# Patient Record
Sex: Female | Born: 1965 | Race: White | Hispanic: No | Marital: Single | Smoking: Current every day smoker
Health system: Southern US, Community
[De-identification: ages and names within clinical notes are randomized; demographics above are authoritative.]

## PROBLEM LIST (undated history)

## (undated) DIAGNOSIS — F419 Anxiety disorder, unspecified: Secondary | ICD-10-CM

## (undated) DIAGNOSIS — Z720 Tobacco use: Secondary | ICD-10-CM

## (undated) DIAGNOSIS — J449 Chronic obstructive pulmonary disease, unspecified: Secondary | ICD-10-CM

## (undated) HISTORY — PX: OTHER SURGICAL HISTORY: SHX169

---

## 2004-12-13 ENCOUNTER — Emergency Department: Payer: Self-pay | Admitting: Emergency Medicine

## 2004-12-14 ENCOUNTER — Emergency Department: Payer: Self-pay | Admitting: Emergency Medicine

## 2006-10-19 ENCOUNTER — Emergency Department: Payer: Self-pay

## 2007-05-05 ENCOUNTER — Emergency Department: Payer: Self-pay | Admitting: Emergency Medicine

## 2008-11-03 ENCOUNTER — Emergency Department: Payer: Self-pay | Admitting: Emergency Medicine

## 2009-01-14 ENCOUNTER — Other Ambulatory Visit: Payer: Self-pay | Admitting: Emergency Medicine

## 2009-01-14 IMAGING — CR DG CHEST 2V
1 series · 2 of 2 positions shown · non-contrast
Comparison: none

REASON FOR EXAM: cough, congestion, chest pain, pt in waiting room
COMMENTS:

PROCEDURE:     DXR - DXR CHEST PA (OR AP) AND LATERAL  - [DATE] [DATE]
RESULT:     The lungs are adequately inflated and clear. There is no focal
infiltrate. On the lateral film mild flattening of the hemidiaphragms is
seen. There is no pleural effusion. The bony thorax appears intact.

[Series 1: view not recorded · 0.17mm/px · 2 of 2 slices shown]
[im 1/2]
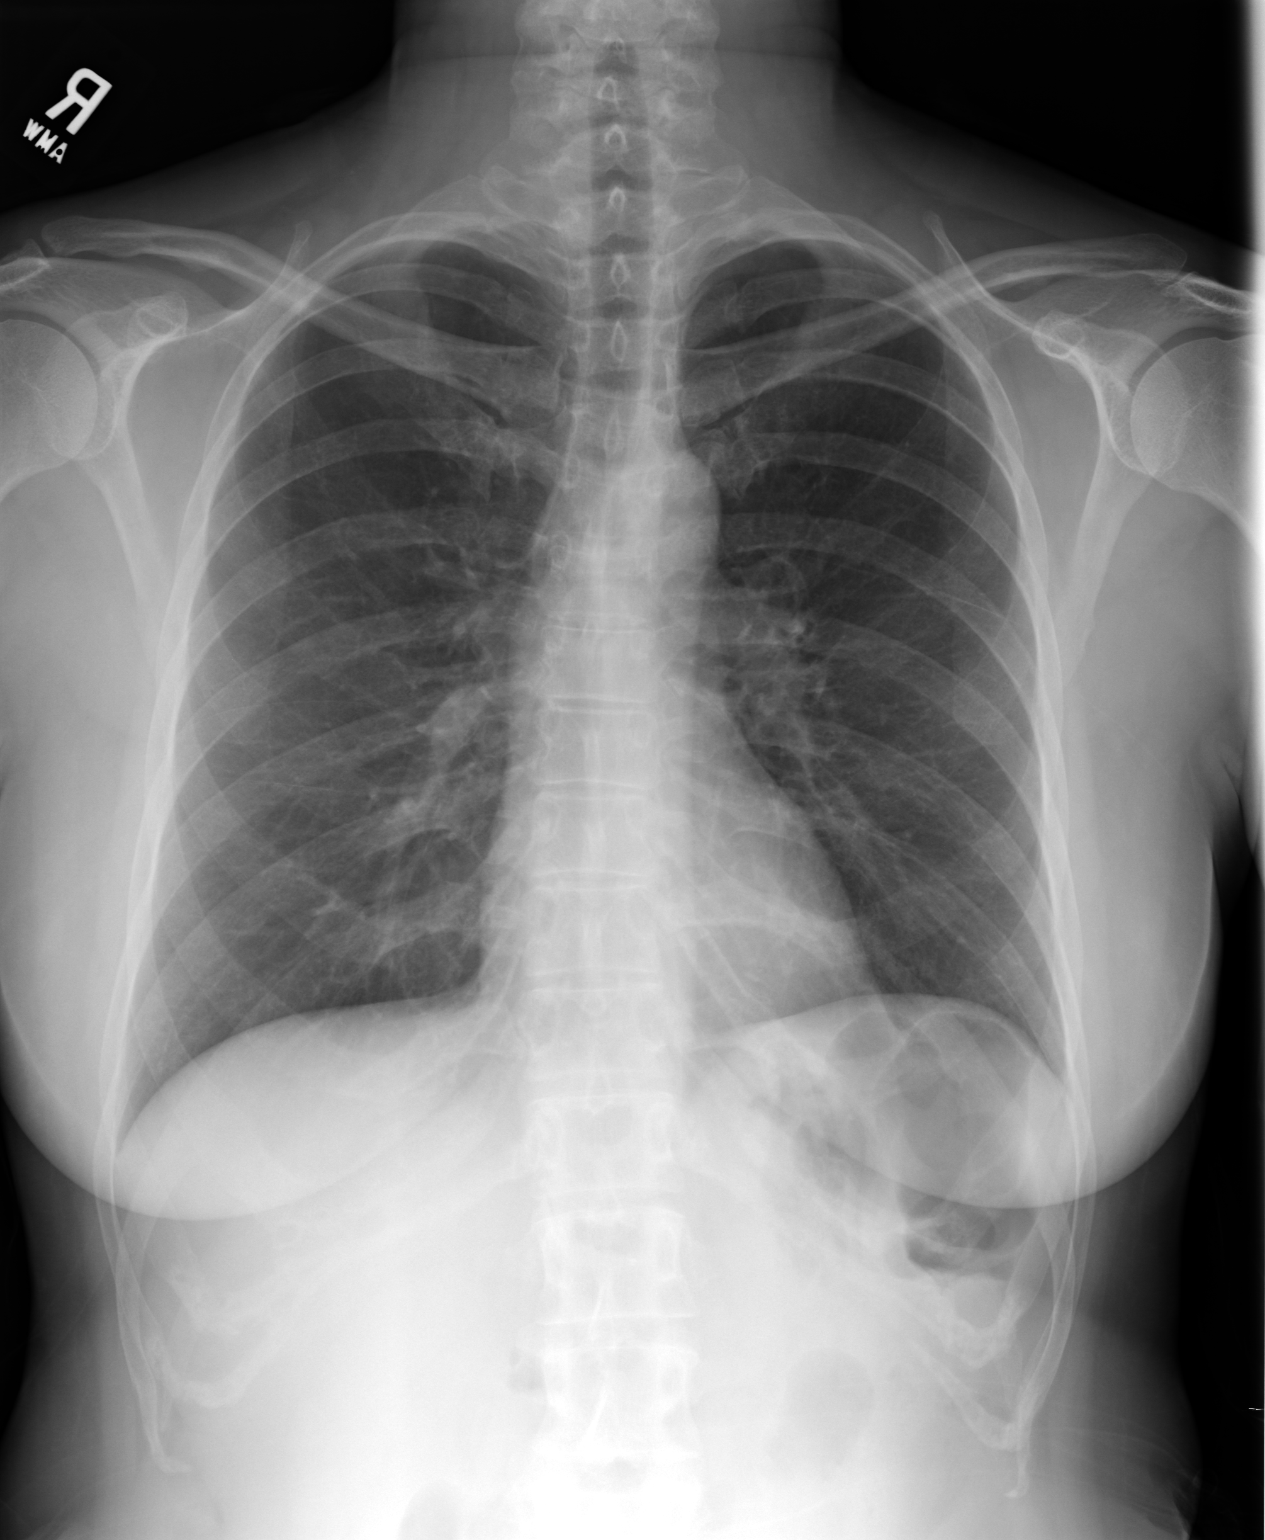
[im 2/2]
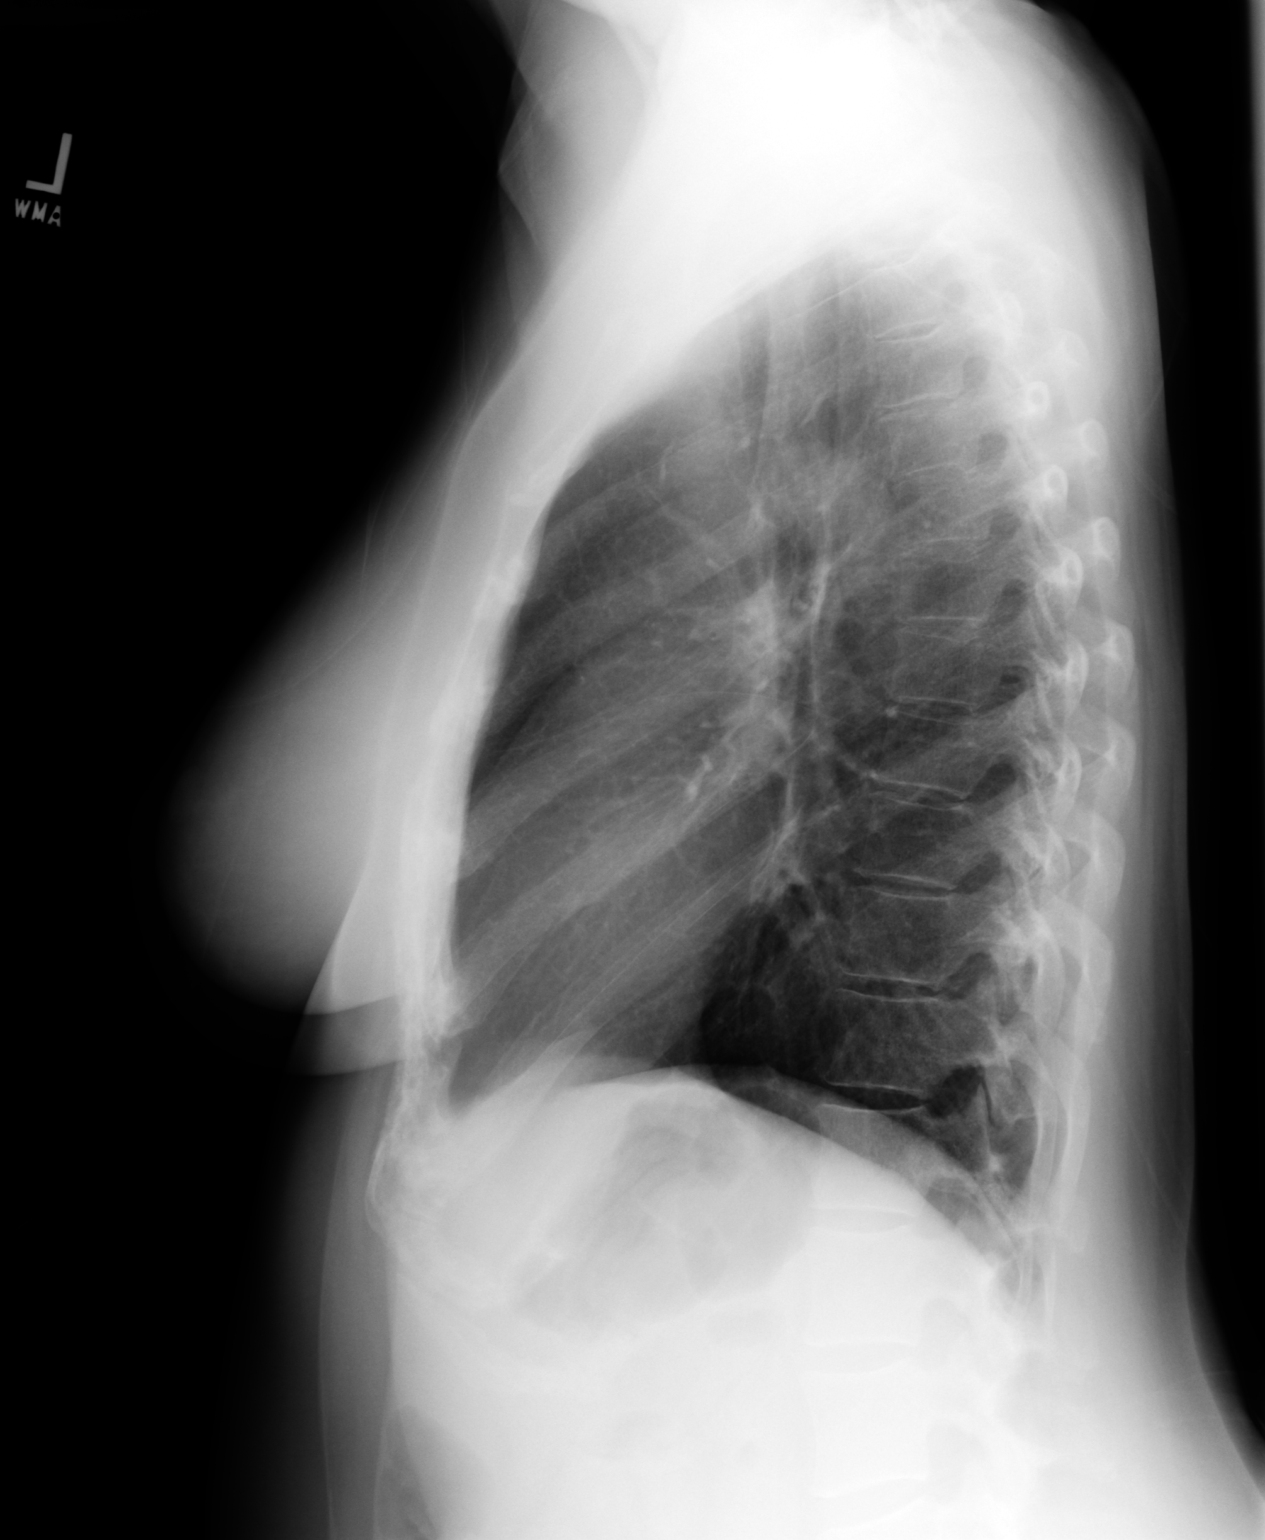

[2 of 2 positions shown; findings below may reference images not displayed]

IMPRESSION: I do not see evidence of pneumonia. There is likely an
element of underlying COPD or reactive airway disease.

## 2011-01-23 ENCOUNTER — Emergency Department: Payer: Self-pay | Admitting: Emergency Medicine

## 2011-05-11 ENCOUNTER — Encounter: Payer: Self-pay | Admitting: *Deleted

## 2011-05-11 ENCOUNTER — Emergency Department (HOSPITAL_COMMUNITY)
Admission: EM | Admit: 2011-05-11 | Discharge: 2011-05-11 | Disposition: A | Discharge status: Home / Self Care | Payer: Self-pay | Attending: Emergency Medicine | Admitting: Emergency Medicine

## 2011-05-11 ENCOUNTER — Other Ambulatory Visit: Payer: Self-pay

## 2011-05-11 ENCOUNTER — Emergency Department (HOSPITAL_COMMUNITY): Payer: Self-pay

## 2011-05-11 DIAGNOSIS — K0889 Other specified disorders of teeth and supporting structures: Secondary | ICD-10-CM

## 2011-05-11 DIAGNOSIS — R072 Precordial pain: Secondary | ICD-10-CM | POA: Insufficient documentation

## 2011-05-11 DIAGNOSIS — R1013 Epigastric pain: Secondary | ICD-10-CM | POA: Insufficient documentation

## 2011-05-11 DIAGNOSIS — J4489 Other specified chronic obstructive pulmonary disease: Secondary | ICD-10-CM | POA: Insufficient documentation

## 2011-05-11 DIAGNOSIS — K089 Disorder of teeth and supporting structures, unspecified: Secondary | ICD-10-CM | POA: Insufficient documentation

## 2011-05-11 DIAGNOSIS — F17200 Nicotine dependence, unspecified, uncomplicated: Secondary | ICD-10-CM | POA: Insufficient documentation

## 2011-05-11 DIAGNOSIS — J449 Chronic obstructive pulmonary disease, unspecified: Secondary | ICD-10-CM | POA: Insufficient documentation

## 2011-05-11 DIAGNOSIS — K029 Dental caries, unspecified: Secondary | ICD-10-CM | POA: Insufficient documentation

## 2011-05-11 HISTORY — DX: Chronic obstructive pulmonary disease, unspecified: J44.9

## 2011-05-11 LAB — COMPREHENSIVE METABOLIC PANEL
Alkaline Phosphatase: 79 U/L (ref 39–117)
BUN: 10 mg/dL (ref 6–23)
Creatinine, Ser: 0.81 mg/dL (ref 0.50–1.10)
GFR calc Af Amer: >90 mL/min (ref >90)
Glucose, Bld: 95 mg/dL (ref 70–99)
Potassium: 3.7 mEq/L (ref 3.5–5.1)
Total Bilirubin: 0.2 mg/dL — ABNORMAL LOW (ref 0.3–1.2)
Total Protein: 6.5 g/dL (ref 6.0–8.3)

## 2011-05-11 LAB — CBC
HCT: 38.4 % (ref 36.0–46.0)
Hemoglobin: 13.1 g/dL (ref 12.0–15.0)
MCH: 30.7 pg (ref 26.0–34.0)
MCHC: 34.1 g/dL (ref 30.0–36.0)
MCV: 89.9 fL (ref 78.0–100.0)
RBC: 4.27 MIL/uL (ref 3.87–5.11)

## 2011-05-11 LAB — DIFFERENTIAL
Basophils Relative: 1 % (ref 0–1)
Eosinophils Absolute: 0.2 10*3/uL (ref 0.0–0.7)
Lymphs Abs: 2 10*3/uL (ref 0.7–4.0)
Monocytes Absolute: 0.6 10*3/uL (ref 0.1–1.0)
Monocytes Relative: 7 % (ref 3–12)
Neutrophils Relative %: 66 % (ref 43–77)

## 2011-05-11 LAB — LIPASE, BLOOD: Lipase: 33 U/L (ref 11–59)

## 2011-05-11 LAB — POCT I-STAT TROPONIN I: Troponin i, poc: 0.04 ng/mL (ref 0.00–0.08)

## 2011-05-11 IMAGING — CR DG CHEST 2V
2 series · 2 of 2 positions shown · non-contrast
Comparison: None.

CLINICAL DATA: Chest pain.

CHEST - 2 VIEW

[w chest pa]
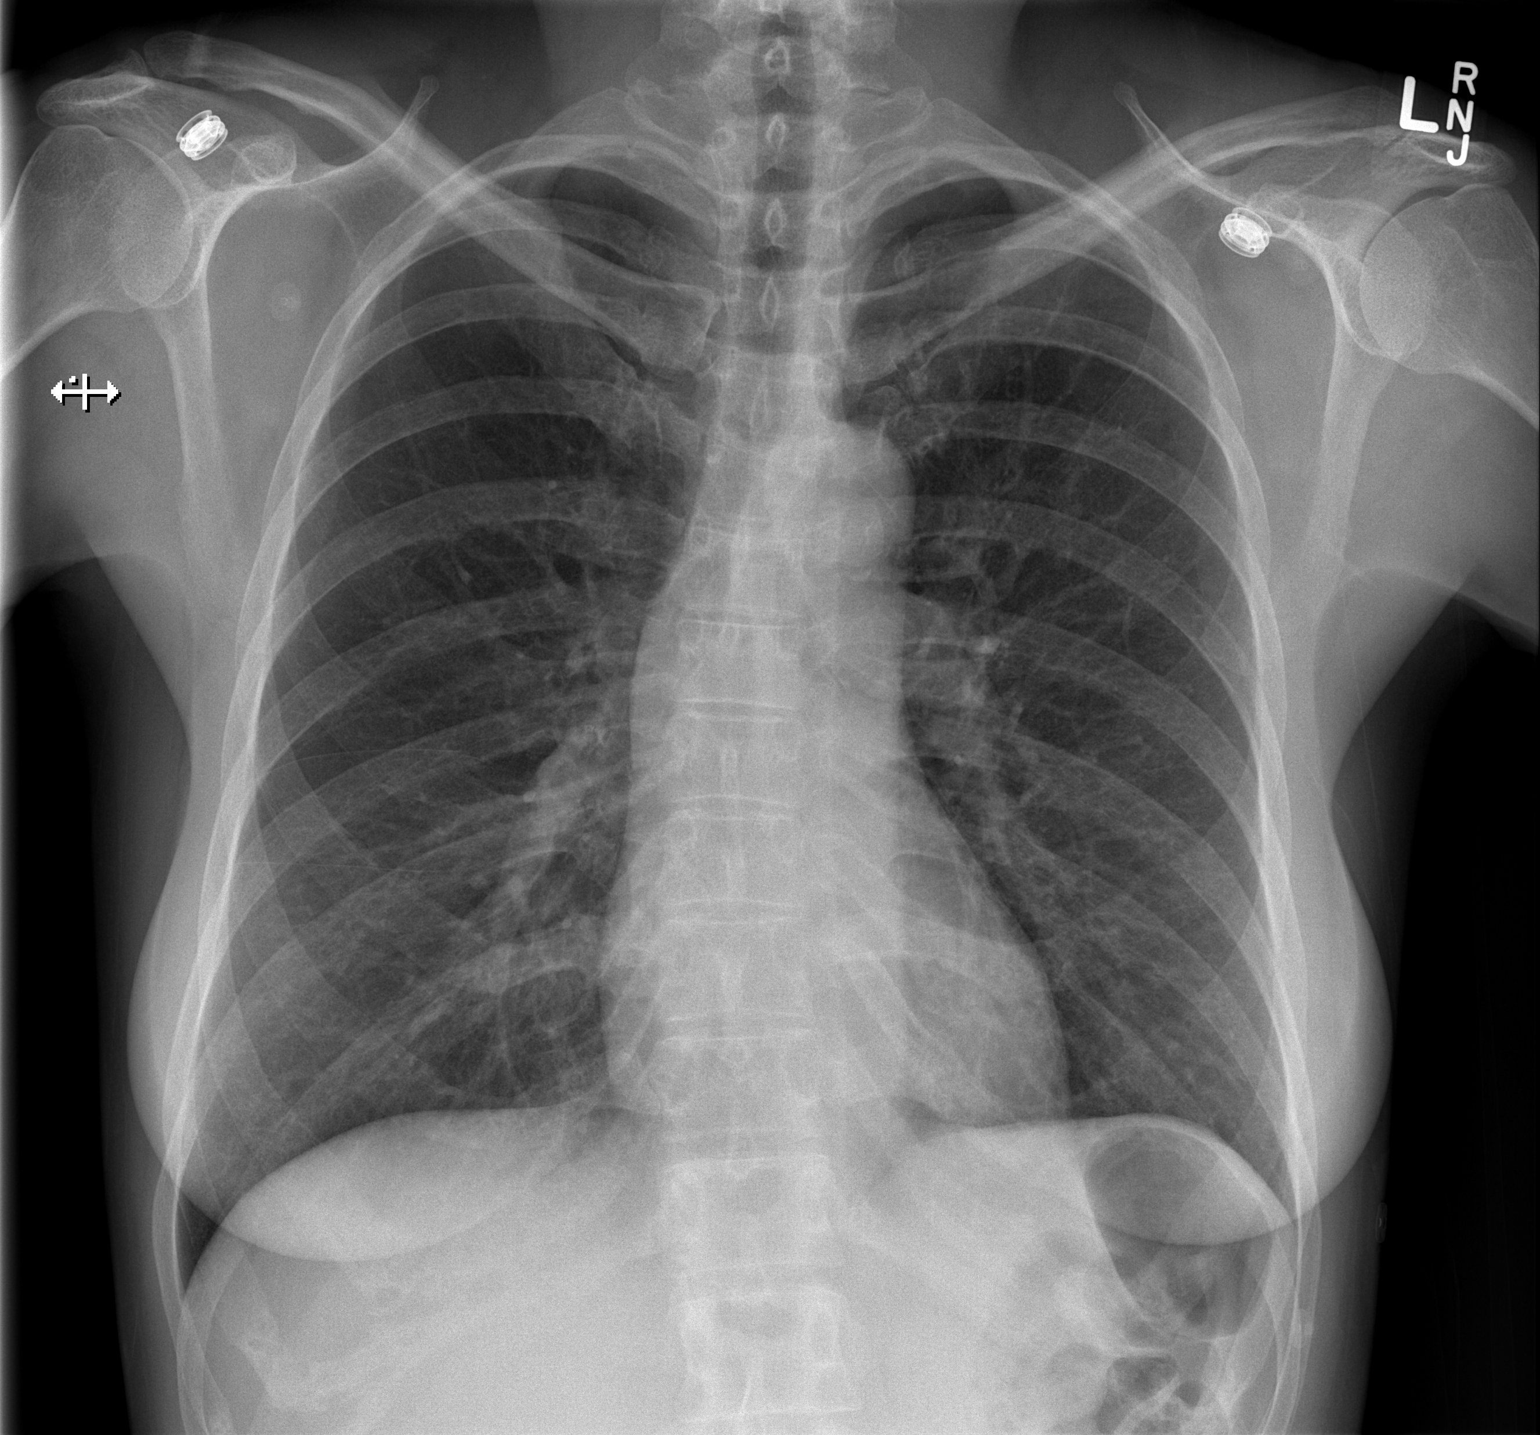

[w chest lat]
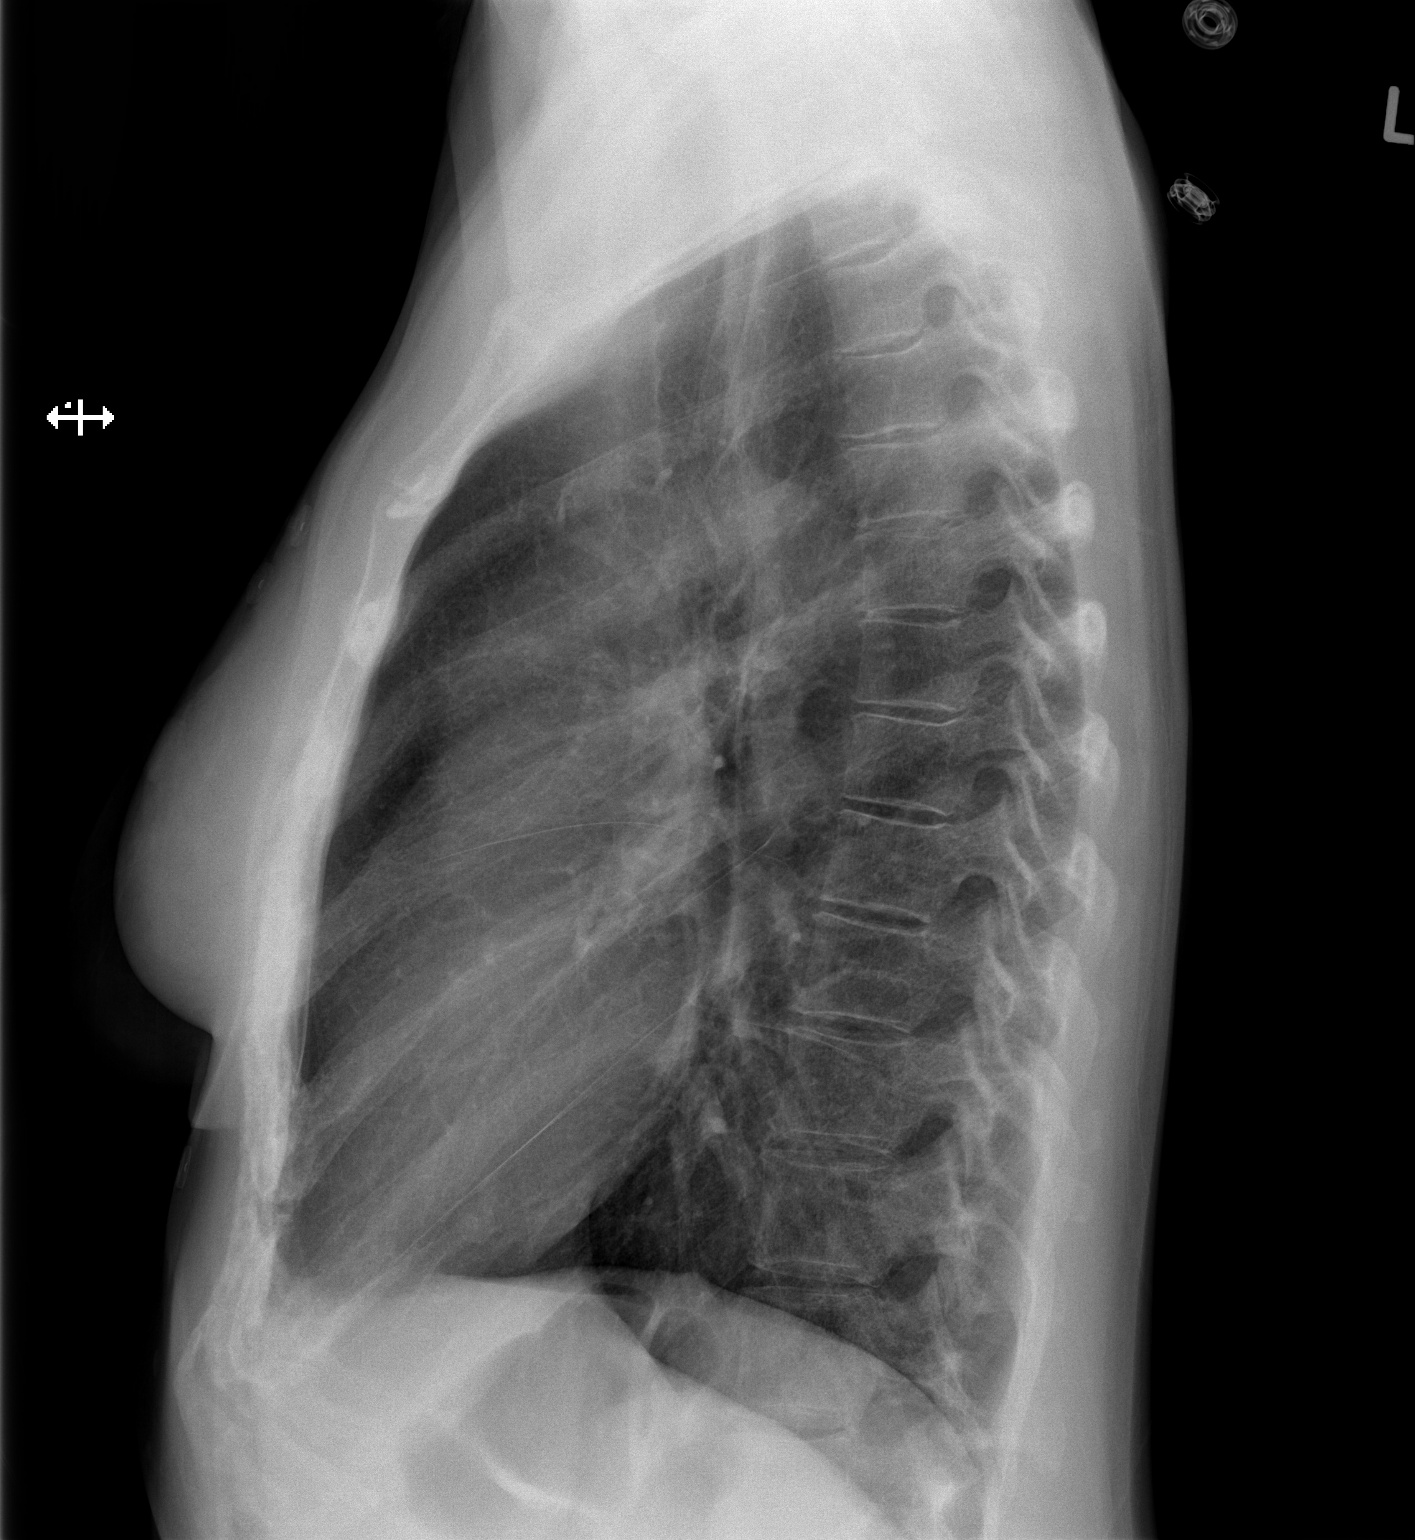

[2 of 2 positions shown; findings below may reference images not displayed]

FINDINGS: Cardiac and mediastinal contours appear normal.

The lungs appear clear.

No pleural effusion is identified.
IMPRESSION: No significant abnormality identified.

## 2011-05-11 MED ORDER — PENICILLIN V POTASSIUM 500 MG PO TABS: 500.0000 mg | ORAL_TABLET | Freq: Three times a day (TID) | ORAL | Status: AC

## 2011-05-11 MED ORDER — GI COCKTAIL ~~LOC~~
30.0000 mL | Freq: Once | ORAL | Status: AC
  Administered 2011-05-11: 30 mL via ORAL
  Filled 2011-05-11: qty 30

## 2011-05-11 MED ORDER — IBUPROFEN 800 MG PO TABS: ORAL_TABLET | ORAL | Status: DC

## 2011-05-11 MED ORDER — ACETAMINOPHEN-CODEINE #3 300-30 MG PO TABS
1.0000 | ORAL_TABLET | Freq: Once | ORAL | Status: AC
  Administered 2011-05-11: 1 via ORAL
  Filled 2011-05-11: qty 1

## 2011-05-11 MED ORDER — ACETAMINOPHEN 325 MG PO TABS
650.0000 mg | ORAL_TABLET | Freq: Once | ORAL | Status: AC
  Administered 2011-05-11: 650 mg via ORAL
  Filled 2011-05-11: qty 1

## 2011-05-11 MED ORDER — ONDANSETRON 4 MG PO TBDP
4.0000 mg | ORAL_TABLET | Freq: Once | ORAL | Status: AC
  Administered 2011-05-11: 4 mg via ORAL
  Filled 2011-05-11: qty 1

## 2011-05-11 MED ORDER — ACETAMINOPHEN-CODEINE #3 300-30 MG PO TABS: 1.0000 | ORAL_TABLET | Freq: Four times a day (QID) | ORAL | Status: AC | PRN

## 2011-05-11 NOTE — ED Notes (Signed)
Tylenol order changed by md after it was scanned but prior to given to pt, will return med to pyxis

## 2011-05-11 NOTE — ED Notes (Signed)
Pt given discharge instructions and rx, verb understanding, amb indep to discharge window

## 2011-05-11 NOTE — ED Notes (Signed)
Pt states she has had a toothache for two days and took 8 aspirin yesterday and now she is burning in her upper chest and hurting,  No nausea or vomiting,  No heart disease

## 2011-05-11 NOTE — ED Provider Notes (Signed)
History     CSN: [id number]  Arrival date & time 05/11/11  0410   First MD Initiated Contact with Patient 05/11/11 0602      Chief Complaint  Patient presents with  . Chest Pain    (Consider location/radiation/quality/duration/timing/severity/associated sxs/prior treatment) HPI  Patient who takes no medicines on a regular basis and states she has no known medical problems but who smokes half-pack of cigarettes a day has been doing so for approximately 30 years presents to emergency department complaining of gradual onset epigastric and mid sternal chest discomfort that began at 2 AM this morning. Patient states she has a multiple day history of left upper dental pain associated with a cavity and states that she took 4 aspirin yesterday morning for the pain. Patient works third shift and prior to going in last night took four additional aspirin at midnight. Patient states that approximately 2 AM she began to feel a burning sensation in her epigastric/lower chest area. Patient states since onset of discomfort, pain has been constant and unchanging. Patient denies associated shortness of breath, nausea, sweating, fevers, chills, nausea, vomiting or diarrhea. Patient denies any early family history of heart disease or heart attack. Patient denies aggravating or alleviating factors.  Past Medical History  Diagnosis Date  . COPD (chronic obstructive pulmonary disease)     Past Surgical History  Procedure Date  . Hemoroidectomy 2003/2002  . Tubiligation     Family History  Problem Relation Age of Onset  . Diabetes Mother   . Hyperlipidemia Mother   . Hypertension Mother     History  Substance Use Topics  . Smoking status: Current Everyday Smoker -- 0.5 packs/day for 25 years    Types: Cigarettes  . Smokeless tobacco: Not on file  . Alcohol Use: No    OB History    Grav Para Term Preterm Abortions TAB SAB Ect Mult Living                  Review of Systems  All other systems  reviewed and are negative.    Allergies  Review of patient's allergies indicates no known allergies.  Home Medications  No current outpatient prescriptions on file.  BP 152/90  Pulse 66  Temp(Src) 98.6 F (37 C) (Oral)  Resp 18  Ht 5\' 3"  (1.6 m)  Wt 108 lb (48.988 kg)  BMI 19.13 kg/m2  SpO2 100%  LMP 04/26/2011  Physical Exam  Nursing note and vitals reviewed. Constitutional: She is oriented to person, place, and time. She appears well-developed and well-nourished. No distress.  HENT:  Head: Normocephalic and atraumatic.  Eyes: Conjunctivae and EOM are normal. Pupils are equal, round, and reactive to light.  Neck: Normal range of motion. Neck supple.  Cardiovascular: Normal rate, regular rhythm, normal heart sounds and intact distal pulses.  Exam reveals no gallop and no friction rub.   No murmur heard. Pulmonary/Chest: Effort normal and breath sounds normal. No respiratory distress. She has no wheezes. She has no rales. She exhibits no tenderness.  Abdominal: Bowel sounds are normal. She exhibits no distension and no mass. There is tenderness in the epigastric area. There is no rebound and no guarding.       Mild epigastric tenderness to palpation. No rigidity or rebound TTP.  Musculoskeletal: Normal range of motion. She exhibits no edema and no tenderness.  Neurological: She is alert and oriented to person, place, and time.  Skin: Skin is warm and dry. No rash noted. She is not  diaphoretic. No erythema.  Psychiatric: She has a normal mood and affect.    ED Course  Procedures (including critical care time)  Labs Reviewed  COMPREHENSIVE METABOLIC PANEL - Abnormal; Notable for the following:    Albumin 3.4 (*)    Total Bilirubin 0.2 (*)    GFR calc non Af Amer 85 (*)    All other components within normal limits  CBC  DIFFERENTIAL  LIPASE, BLOOD  POCT I-STAT TROPONIN I  I-STAT TROPONIN I   Dg Chest 2 View  05/11/2011  *RADIOLOGY REPORT*  Clinical Data: Chest pain.   CHEST - 2 VIEW  Comparison: None.  Findings: Cardiac and mediastinal contours appear normal.  The lungs appear clear.  No pleural effusion is identified.  IMPRESSION:  No significant abnormality identified.  Original Report Authenticated By: Dellia Cloud, M.D.    Date: 05/11/2011  Rate: 58  Rhythm: normal sinus rhythm  QRS Axis: normal  Intervals: normal  ST/T Wave abnormalities: normal  Conduction Disutrbances:none  Narrative Interpretation: compared to Jun 04, 2008  Old EKG Reviewed: unchanged   By mouth GI cocktail and ODT Zofran.   1. Pain, dental   2. Epigastric pain       MDM  Patient is low risk for pulmonary embolism in this perc negative as well as low risk acute coronary syndrome despite her tobacco abuse. Patient has dental pain without signs or symptoms of dental abscess but I will prophylactically treat with penicillin. Patient's epigastric burning up into the substernal region is improved after GI cocktail and is  likely secondary to her recent large amount of aspirin use. I spoke at length with patient about appropriate over-the-counter pain medicine use with food on stomach. Patient had pain constant for greater than 4 hours with no acute findings on EKG and negative troponin. Patient is agreeable to treating her dental pain with OTC pain meds and covering with PCN then following up with dentist but to return to emergency department for changing or worsening symptoms        Jenness Corner, [place] 05/11/11 [phone]

## 2011-05-15 NOTE — ED Provider Notes (Signed)
Medical screening examination/treatment/procedure(s) were performed by non-physician practitioner and as supervising physician I was immediately available for consultation/collaboration.  Chynna Buerkle K Shandee Jergens-Rasch, MD 05/15/11 0017 

## 2013-03-28 ENCOUNTER — Emergency Department: Payer: Self-pay | Admitting: Emergency Medicine

## 2013-03-28 LAB — URINALYSIS, COMPLETE
Hyaline Cast: 2
Nitrite: NEGATIVE
Ph: 5 (ref 4.5–8.0)
Specific Gravity: 1.028 (ref 1.003–1.030)
Squamous Epithelial: 5

## 2013-03-28 IMAGING — CR DG CHEST 2V
1 series · 2 of 2 positions shown · non-contrast
Comparison: Chest radiograph [DATE]

CLINICAL DATA: Cough, congestion.

EXAM:
CHEST  2 VIEW

[Series 1: pa · 0.17mm/px · 2 of 2 slices shown]
[im 1/2]
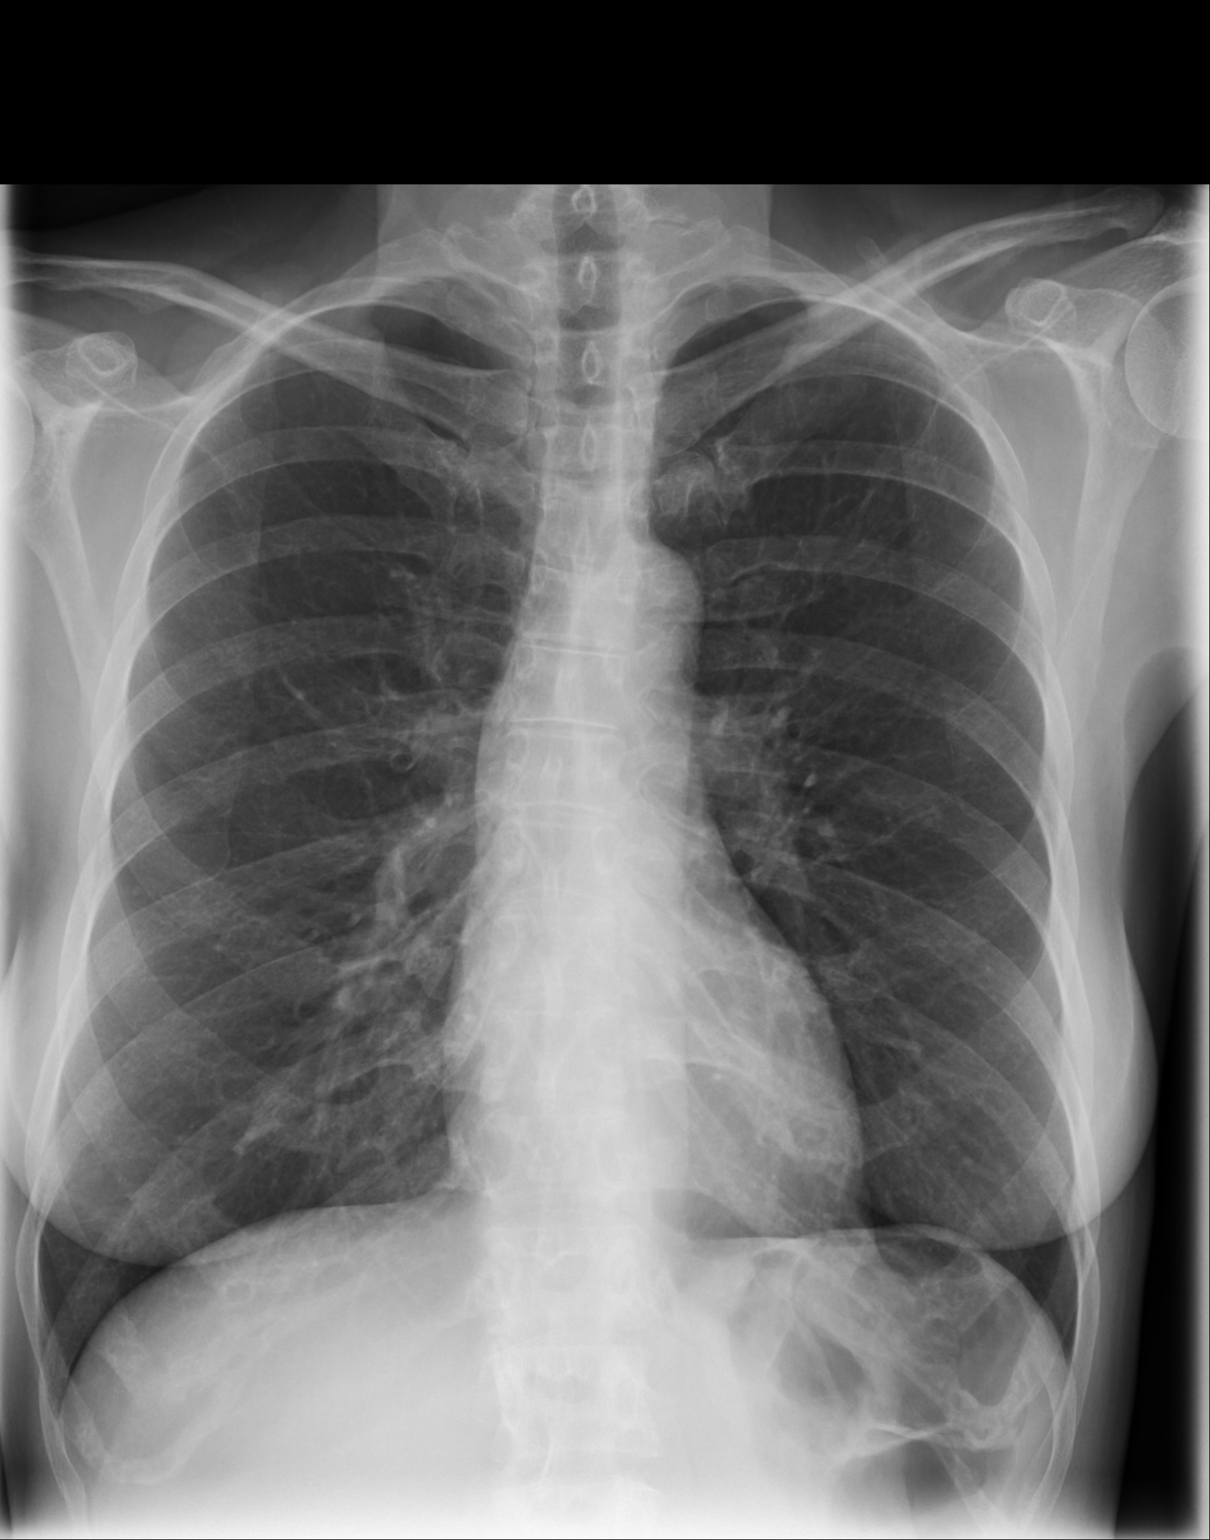
[im 2/2]
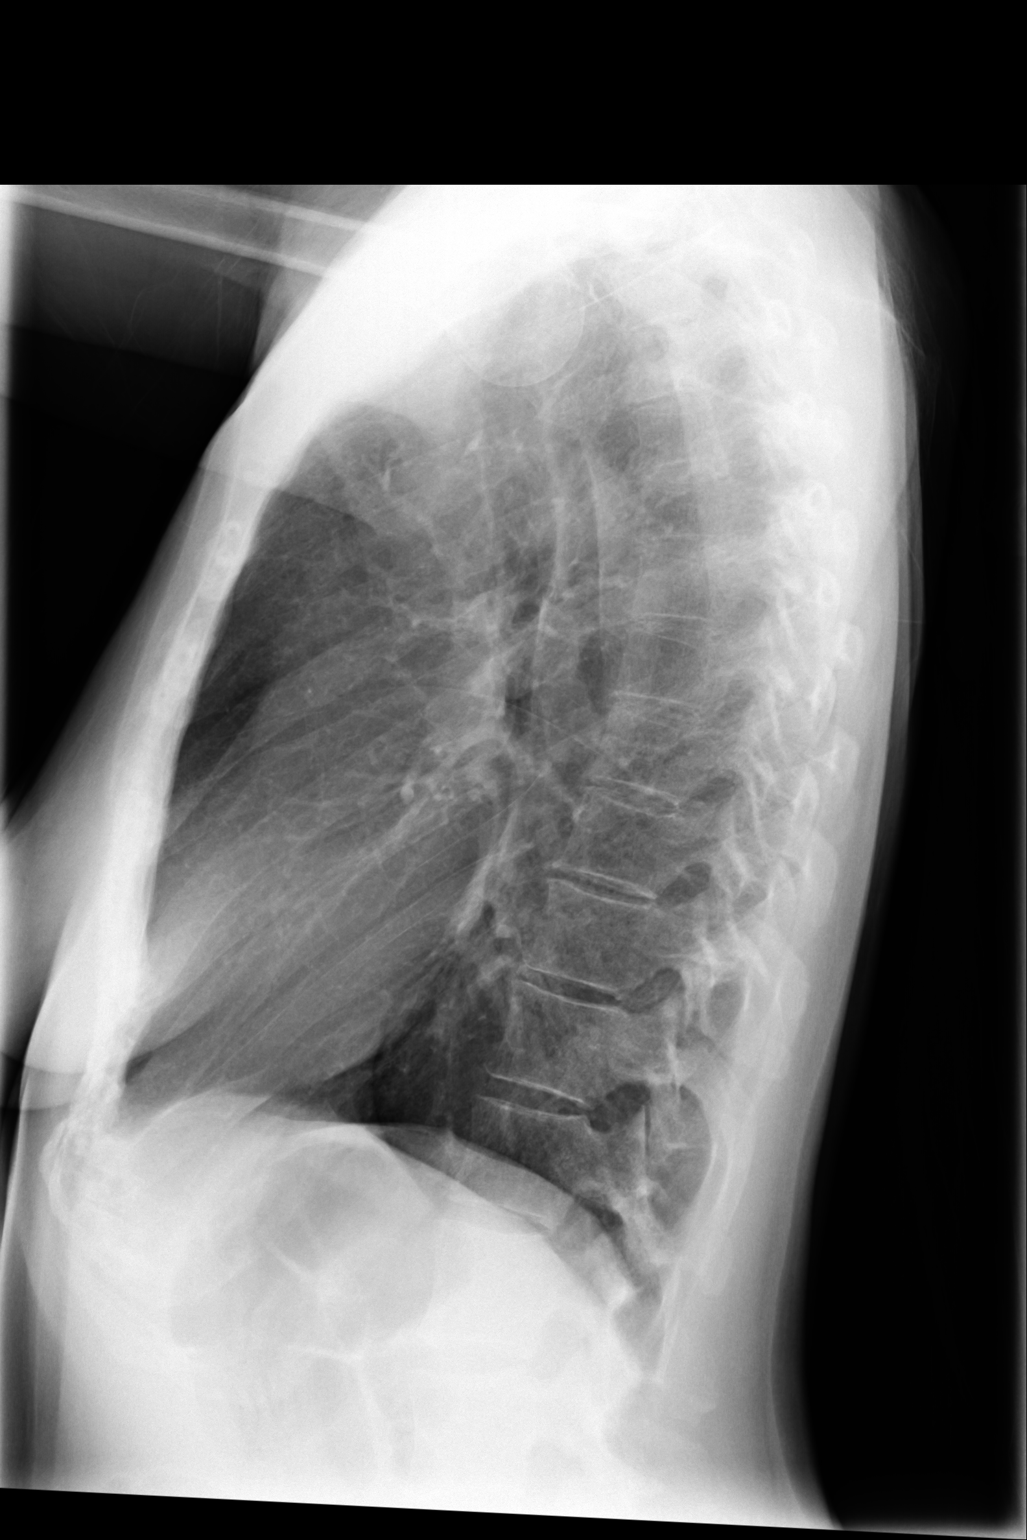

[2 of 2 positions shown; findings below may reference images not displayed]

FINDINGS: Cardiomediastinal silhouette is unremarkable. The lungs are clear
without pleural effusions or focal consolidations. Mild pulmonary
hyper expansion could reflect increased inspiratory effort.
Pulmonary vasculature is unremarkable. Trachea projects midline and
there is no pneumothorax. Soft tissue planes and included osseous
structures are nonsuspicious.
IMPRESSION: No active cardiopulmonary disease.

  By: JIM

## 2013-03-30 ENCOUNTER — Emergency Department: Payer: Self-pay | Admitting: Emergency Medicine

## 2014-02-13 ENCOUNTER — Emergency Department: Payer: Self-pay | Admitting: Emergency Medicine

## 2014-02-13 LAB — CBC
HCT: 43.4 % (ref 35.0–47.0)
HGB: 14.1 g/dL (ref 12.0–16.0)
MCH: 29.9 pg (ref 26.0–34.0)
MCHC: 32.5 g/dL (ref 32.0–36.0)
MCV: 92 fL (ref 80–100)
Platelet: 238 10*3/uL (ref 150–440)
RBC: 4.71 10*6/uL (ref 3.80–5.20)
RDW: 13.7 % (ref 11.5–14.5)
WBC: 9.6 10*3/uL (ref 3.6–11.0)

## 2014-02-13 LAB — COMPREHENSIVE METABOLIC PANEL
Albumin: 3.7 g/dL (ref 3.4–5.0)
Alkaline Phosphatase: 87 U/L
Anion Gap: 6 — ABNORMAL LOW (ref 7–16)
BUN: 10 mg/dL (ref 7–18)
Bilirubin,Total: 0.5 mg/dL (ref 0.2–1.0)
CALCIUM: 8.8 mg/dL (ref 8.5–10.1)
CO2: 27 mmol/L (ref 21–32)
Chloride: 108 mmol/L — ABNORMAL HIGH (ref 98–107)
Creatinine: 0.78 mg/dL (ref 0.60–1.30)
EGFR (Non-African Amer.): >60
GLUCOSE: 92 mg/dL (ref 65–99)
Osmolality: 280 (ref 275–301)
Potassium: 4.1 mmol/L (ref 3.5–5.1)
SGOT(AST): 9 U/L — ABNORMAL LOW (ref 15–37)
SGPT (ALT): 14 U/L
Sodium: 141 mmol/L (ref 136–145)
Total Protein: 7.1 g/dL (ref 6.4–8.2)

## 2014-02-13 LAB — ACETAMINOPHEN LEVEL: Acetaminophen: <2 ug/mL

## 2014-02-13 LAB — ETHANOL

## 2014-02-13 LAB — SALICYLATE LEVEL: Salicylates, Serum: 3.4 mg/dL — ABNORMAL HIGH

## 2014-02-14 LAB — URINALYSIS, COMPLETE
Bilirubin,UR: NEGATIVE
Blood: NEGATIVE
Glucose,UR: NEGATIVE mg/dL (ref 0–75)
Hyaline Cast: 7
LEUKOCYTE ESTERASE: NEGATIVE
NITRITE: NEGATIVE
PROTEIN: NEGATIVE
Ph: 5 (ref 4.5–8.0)
RBC,UR: 5 /HPF (ref 0–5)
SPECIFIC GRAVITY: 1.02 (ref 1.003–1.030)
Squamous Epithelial: 48

## 2014-02-14 LAB — DRUG SCREEN, URINE
AMPHETAMINES, UR SCREEN: NEGATIVE (ref ?–1000)
BENZODIAZEPINE, UR SCRN: NEGATIVE (ref ?–200)
Barbiturates, Ur Screen: NEGATIVE (ref ?–200)
COCAINE METABOLITE, UR ~~LOC~~: POSITIVE (ref ?–300)
Cannabinoid 50 Ng, Ur ~~LOC~~: NEGATIVE (ref ?–50)
MDMA (Ecstasy)Ur Screen: NEGATIVE (ref ?–500)
Methadone, Ur Screen: NEGATIVE (ref ?–300)
OPIATE, UR SCREEN: NEGATIVE (ref ?–300)
PHENCYCLIDINE (PCP) UR S: NEGATIVE (ref ?–25)
Tricyclic, Ur Screen: NEGATIVE (ref ?–1000)

## 2015-05-28 ENCOUNTER — Emergency Department
Admission: EM | Admit: 2015-05-28 | Discharge: 2015-05-28 | Disposition: A | Discharge status: Home / Self Care | Payer: Self-pay | Attending: Emergency Medicine | Admitting: Emergency Medicine

## 2015-05-28 ENCOUNTER — Emergency Department: Payer: Self-pay

## 2015-05-28 ENCOUNTER — Encounter: Payer: Self-pay | Admitting: Emergency Medicine

## 2015-05-28 DIAGNOSIS — F17210 Nicotine dependence, cigarettes, uncomplicated: Secondary | ICD-10-CM | POA: Insufficient documentation

## 2015-05-28 DIAGNOSIS — J449 Chronic obstructive pulmonary disease, unspecified: Secondary | ICD-10-CM | POA: Insufficient documentation

## 2015-05-28 DIAGNOSIS — Z79899 Other long term (current) drug therapy: Secondary | ICD-10-CM | POA: Insufficient documentation

## 2015-05-28 DIAGNOSIS — J209 Acute bronchitis, unspecified: Secondary | ICD-10-CM | POA: Insufficient documentation

## 2015-05-28 IMAGING — CR DG CHEST 2V
1 series · 2 of 2 positions shown · non-contrast
Comparison: PA and lateral chest [DATE] and [DATE].

CLINICAL DATA: Cough and fever for 3 days.  Initial encounter.

EXAM:
CHEST  2 VIEW

[Series 1: w chest pa · 0.14mm/px · 2 of 2 slices shown]
[im 1/2]
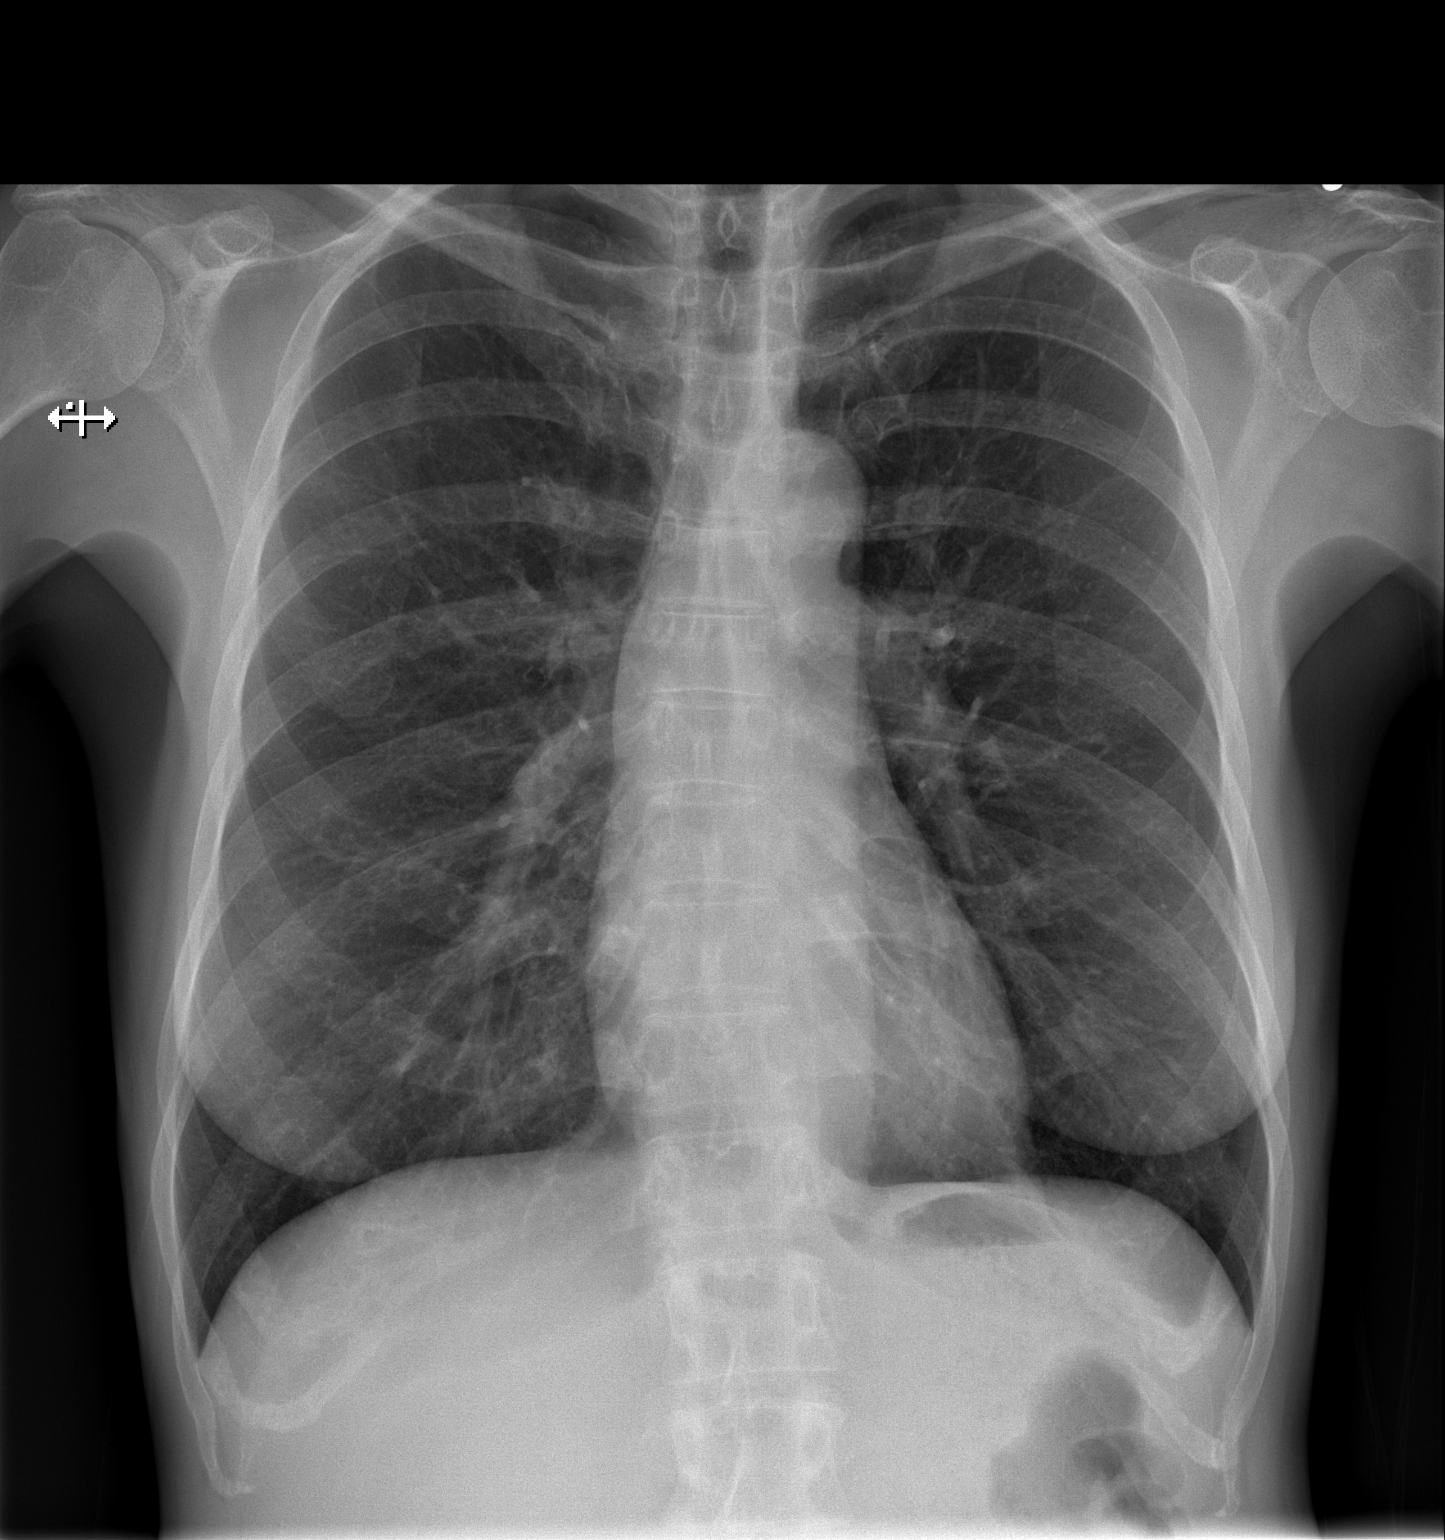
[im 2/2]
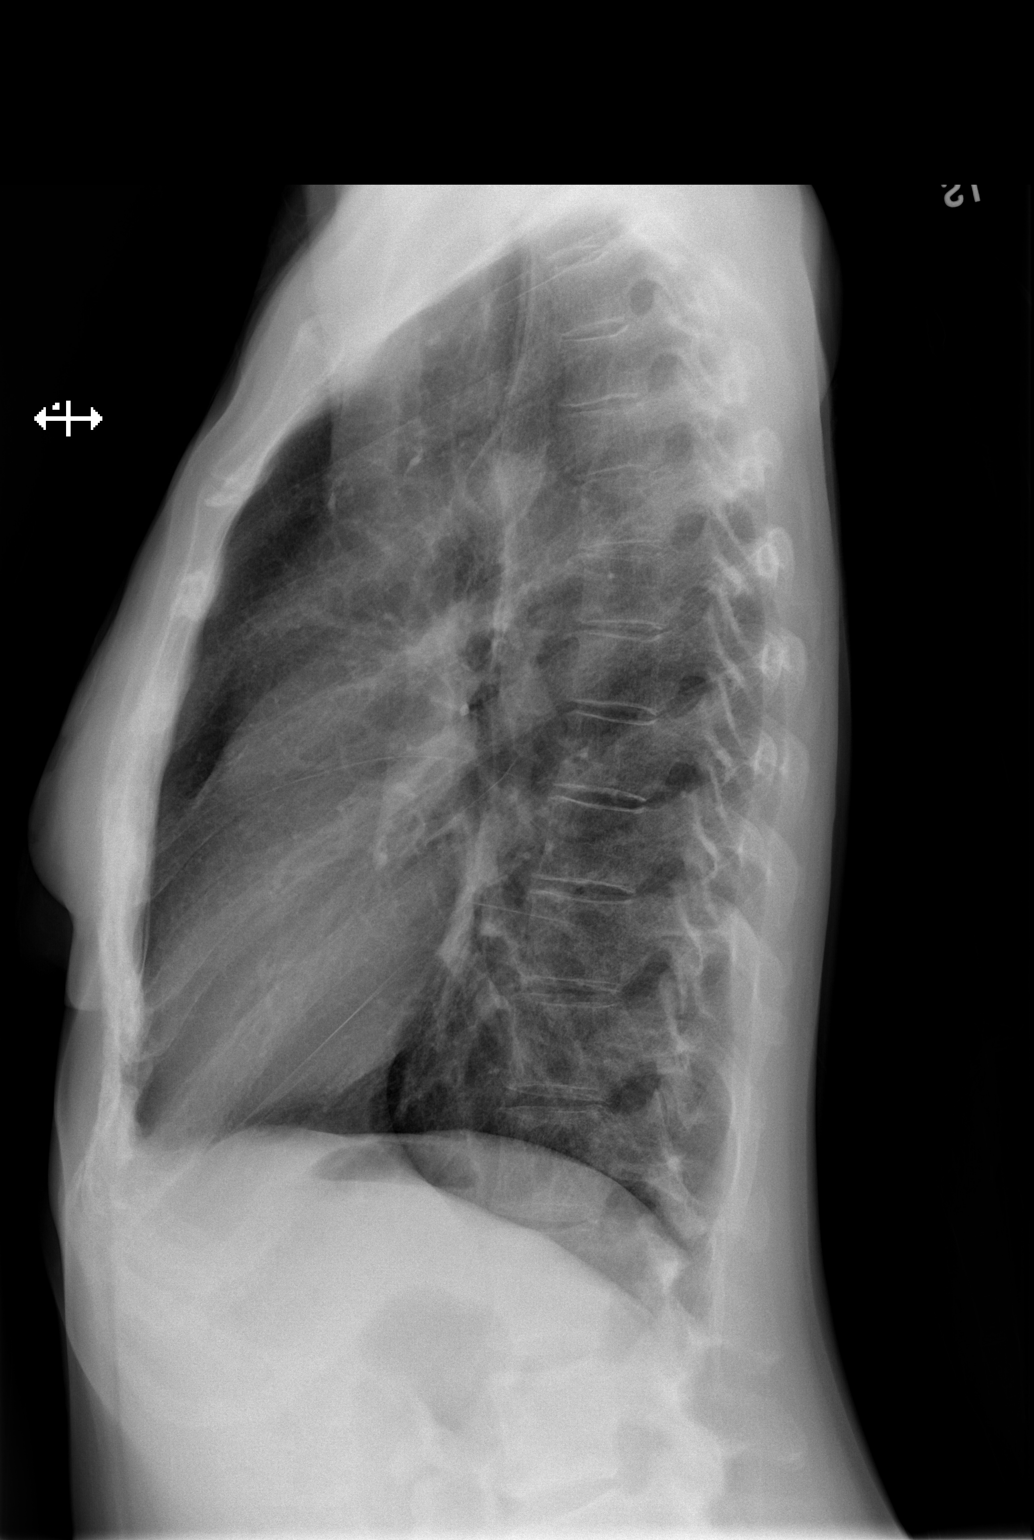

[2 of 2 positions shown; findings below may reference images not displayed]

FINDINGS: The lungs are clear. Heart size is normal. There is no pneumothorax
or pleural effusion. No focal bony abnormality.
IMPRESSION: No acute disease.

## 2015-05-28 MED ORDER — BENZONATATE 100 MG PO CAPS: 100.0000 mg | ORAL_CAPSULE | Freq: Three times a day (TID) | ORAL | Status: DC | PRN

## 2015-05-28 MED ORDER — AZITHROMYCIN 250 MG PO TABS: ORAL_TABLET | ORAL | Status: DC

## 2015-05-28 MED ORDER — IPRATROPIUM-ALBUTEROL 0.5-2.5 (3) MG/3ML IN SOLN
3.0000 mL | Freq: Once | RESPIRATORY_TRACT | Status: AC
  Administered 2015-05-28: 3 mL via RESPIRATORY_TRACT
  Filled 2015-05-28: qty 3

## 2015-05-28 NOTE — ED Provider Notes (Signed)
[facility] Emergency Department Provider Note ____________________________________________  Time seen: 2129  I have reviewed the triage vital signs and the nursing notes.  HISTORY  Chief Complaint  Cough and Fever  HPI Maureen Cook is a 49 y.o. female presents to the ED for evaluation of intermittent, non-productive cough and cold symptoms since December. She also notes some burning to her central chest with coughing. She describes the symptoms have worsened this week. She also notes some intermittent subjective fevers.She reports a Tmax of 103F yesterday. Fevers are responding to Tylenol and BC Powders. She continues to smoke despite her symptoms.   Past Medical History  Diagnosis Date  . COPD (chronic obstructive pulmonary disease) (HCC)     There are no active problems to display for this patient.   Past Surgical History  Procedure Laterality Date  . Hemoroidectomy  2003/2002  . Tubiligation      Current Outpatient Rx  Name  Route  Sig  Dispense  Refill  . azithromycin (ZITHROMAX Z-PAK) 250 MG tablet      Take 2 tablets (500 mg) on  Day 1,  followed by 1 tablet (250 mg) once daily on Days 2 through 5.   6 each   0   . benzonatate (TESSALON PERLES) 100 MG capsule   Oral   Take 1 capsule (100 mg total) by mouth 3 (three) times daily as needed for cough (Take 1-2 per dose).   30 capsule   0   . ibuprofen (ADVIL,MOTRIN) 800 MG tablet      Take breakfast, lunch and dinner with food on stomache   21 tablet   0     Allergies Review of patient's allergies indicates no known allergies.  Family History  Problem Relation Age of Onset  . Diabetes Mother   . Hyperlipidemia Mother   . Hypertension Mother     Social History Social History  Substance Use Topics  . Smoking status: Current Every Day Smoker -- 0.50 packs/day for 25 years    Types: Cigarettes  . Smokeless tobacco: None  . Alcohol Use: No   Review of  Systems  Constitutional: Positive for fever. Eyes: Negative for visual changes. ENT: Negative for sore throat. Cardiovascular: Negative for chest pain. Respiratory: Negative for shortness of breath. Reports cough as above Gastrointestinal: Negative for abdominal pain, vomiting and diarrhea. Genitourinary: Negative for dysuria. Musculoskeletal: Negative for back pain. Skin: Negative for rash. Neurological: Negative for headaches, focal weakness or numbness. ____________________________________________  PHYSICAL EXAM:  VITAL SIGNS: ED Triage Vitals  Enc Vitals Group     BP 05/28/15 2056 131/63 mmHg     Pulse Rate 05/28/15 2056 76     Resp 05/28/15 2056 16     Temp 05/28/15 2056 97.7 F (36.5 C)     Temp Source 05/28/15 2056 Oral     SpO2 05/28/15 2056 97 %     Weight 05/28/15 2056 100 lb (45.36 kg)     Height 05/28/15 2056  (1.575 m)     Head Cir --      Peak Flow --      Pain Score 05/28/15 2100 6     Pain Loc --      Pain Edu? --      Excl. in GC? --    Constitutional: Alert and oriented. Well appearing and in no distress. Head: Normocephalic and atraumatic.      Eyes: Conjunctivae are normal. PERRL. Normal extraocular movements  Ears: Canals clear. TMs intact bilaterally.   Nose: No congestion/rhinorrhea.   Mouth/Throat: Mucous membranes are moist.   Neck: Supple. No thyromegaly. Hematological/Lymphatic/Immunological: No cervical lymphadenopathy. Cardiovascular: Normal rate, regular rhythm.  Respiratory: Normal respiratory effort. No wheezes/rales/rhonchi. Gastrointestinal: Soft and nontender. No distention. Musculoskeletal: Nontender with normal range of motion in all extremities.  Neurologic:  Normal gait without ataxia. Normal speech and language. No gross focal neurologic deficits are appreciated. Skin:  Skin is warm, dry and intact. No rash noted. Psychiatric: Mood and affect are normal. Patient exhibits appropriate insight and  judgment. ____________________________________________   RADIOLOGY CXR IMPRESSION: No acute disease.  I, Jarrell Armond, Charlesetta Ivory, personally viewed and evaluated these images (plain radiographs) as part of my medical decision making, as well as reviewing the written report by the radiologist. ____________________________________________  PROCEDURES  DuoNeb x 1 ____________________________________________  INITIAL IMPRESSION / ASSESSMENT AND PLAN / ED COURSE  Patient with improved breathing subjectively following breathing treatment. No radiologic evidence of an acute process. She likely is developing with a flare of her underlying COPD. She was treated empirically for bronchitis with a potential bacterial etiology considering her long duration intermittent fevers. She is discharged with a prescription for azithromycin and Tessalon Perles to dose as directed. She also encouraged to smoke one less cigarettes per day over the next week. To follow-up with [facility] for ongoing symptoms. She return to the ED for worsening symptoms acutely. ____________________________________________  FINAL CLINICAL IMPRESSION(S) / ED DIAGNOSES  Final diagnoses:  Bronchitis with bronchospasm      Lissa Hoard, PA-C 05/28/15 2258  Jennye Moccasin, MD 05/28/15 [phone]

## 2015-05-28 NOTE — ED Notes (Signed)
Intermittent cough and cold symptoms since December.  Symptoms worse this week.  Patient states running fevers.

## 2015-05-28 NOTE — Discharge Instructions (Signed)
Upper Respiratory Infection, Adult Most upper respiratory infections (URIs) are a viral infection of the air passages leading to the lungs. A URI affects the nose, throat, and upper air passages. The most common type of URI is nasopharyngitis and is typically referred to as "the common cold." URIs run their course and usually go away on their own. Most of the time, a URI does not require medical attention, but sometimes a bacterial infection in the upper airways can follow a viral infection. This is called a secondary infection. Sinus and middle ear infections are common types of secondary upper respiratory infections. Bacterial pneumonia can also complicate a URI. A URI can worsen asthma and chronic obstructive pulmonary disease (COPD). Sometimes, these complications can require emergency medical care and may be life threatening.  CAUSES Almost all URIs are caused by viruses. A virus is a type of germ and can spread from one person to another.  RISKS FACTORS You may be at risk for a URI if:   You smoke.   You have chronic heart or lung disease.  You have a weakened defense (immune) system.   You are very young or very old.   You have nasal allergies or asthma.  You work in crowded or poorly ventilated areas.  You work in health care facilities or schools. SIGNS AND SYMPTOMS  Symptoms typically develop 2-3 days after you come in contact with a cold virus. Most viral URIs last 7-10 days. However, viral URIs from the influenza virus (flu virus) can last 14-18 days and are typically more severe. Symptoms may include:   Runny or stuffy (congested) nose.   Sneezing.   Cough.   Sore throat.   Headache.   Fatigue.   Fever.   Loss of appetite.   Pain in your forehead, behind your eyes, and over your cheekbones (sinus pain).  Muscle aches.  DIAGNOSIS  Your health care provider may diagnose a URI by:  Physical exam.  Tests to check that your symptoms are not due to  another condition such as:  Strep throat.  Sinusitis.  Pneumonia.  Asthma. TREATMENT  A URI goes away on its own with time. It cannot be cured with medicines, but medicines may be prescribed or recommended to relieve symptoms. Medicines may help:  Reduce your fever.  Reduce your cough.  Relieve nasal congestion. HOME CARE INSTRUCTIONS   Take medicines only as directed by your health care provider.   Gargle warm saltwater or take cough drops to comfort your throat as directed by your health care provider.  Use a warm mist humidifier or inhale steam from a shower to increase air moisture. This may make it easier to breathe.  Drink enough fluid to keep your urine clear or pale yellow.   Eat soups and other clear broths and maintain good nutrition.   Rest as needed.   Return to work when your temperature has returned to normal or as your health care provider advises. You may need to stay home longer to avoid infecting others. You can also use a face mask and careful hand washing to prevent spread of the virus.  Increase the usage of your inhaler if you have asthma.   Do not use any tobacco products, including cigarettes, chewing tobacco, or electronic cigarettes. If you need help quitting, ask your health care provider. PREVENTION  The best way to protect yourself from getting a cold is to practice good hygiene.   Avoid oral or hand contact with people with cold  symptoms.   Wash your hands often if contact occurs.  There is no clear evidence that vitamin C, vitamin E, echinacea, or exercise reduces the chance of developing a cold. However, it is always recommended to get plenty of rest, exercise, and practice good nutrition.  SEEK MEDICAL CARE IF:   You are getting worse rather than better.   Your symptoms are not controlled by medicine.   You have chills.  You have worsening shortness of breath.  You have brown or red mucus.  You have yellow or brown nasal  discharge.  You have pain in your face, especially when you bend forward.  You have a fever.  You have swollen neck glands.  You have pain while swallowing.  You have white areas in the back of your throat. SEEK IMMEDIATE MEDICAL CARE IF:   You have severe or persistent:  Headache.  Ear pain.  Sinus pain.  Chest pain.  You have chronic lung disease and any of the following:  Wheezing.  Prolonged cough.  Coughing up blood.  A change in your usual mucus.  You have a stiff neck.  You have changes in your:  Vision.  Hearing.  Thinking.  Mood. MAKE SURE YOU:   Understand these instructions.  Will watch your condition.  Will get help right away if you are not doing well or get worse.   This information is not intended to replace advice given to you by your health care provider. Make sure you discuss any questions you have with your health care provider.   Document Released: 10/14/2000 Document Revised: 09/04/2014 Document Reviewed: 07/26/2013 Elsevier Interactive Patient Education 2016 [employer]   Dose the prescription meds as directed. Follow-up with the [facility] for ongoing symptoms. Increase fluid intake and consider dosing OTC Mucinex. Work to reduce the number of cigarettes smoked daily by 1-2 cigarettes over the next weeks.

## 2015-12-12 ENCOUNTER — Emergency Department
Admission: EM | Admit: 2015-12-12 | Discharge: 2015-12-12 | Disposition: A | Discharge status: Home / Self Care | Payer: Self-pay | Attending: Student | Admitting: Student

## 2015-12-12 ENCOUNTER — Encounter: Payer: Self-pay | Admitting: Emergency Medicine

## 2015-12-12 DIAGNOSIS — F17210 Nicotine dependence, cigarettes, uncomplicated: Secondary | ICD-10-CM | POA: Insufficient documentation

## 2015-12-12 DIAGNOSIS — K0889 Other specified disorders of teeth and supporting structures: Secondary | ICD-10-CM | POA: Insufficient documentation

## 2015-12-12 DIAGNOSIS — J449 Chronic obstructive pulmonary disease, unspecified: Secondary | ICD-10-CM | POA: Insufficient documentation

## 2015-12-12 MED ORDER — TRAMADOL HCL 50 MG PO TABS
50.0000 mg | ORAL_TABLET | Freq: Once | ORAL | Status: AC
  Administered 2015-12-12: 50 mg via ORAL
  Filled 2015-12-12: qty 1

## 2015-12-12 MED ORDER — AMOXICILLIN 500 MG PO TABS: 500.0000 mg | ORAL_TABLET | Freq: Three times a day (TID) | ORAL | 0 refills | Status: DC

## 2015-12-12 MED ORDER — TRAMADOL HCL 50 MG PO TABS: 50.0000 mg | ORAL_TABLET | Freq: Four times a day (QID) | ORAL | 0 refills | Status: DC | PRN

## 2015-12-12 NOTE — ED Notes (Signed)
Pt reports dental pain to right upper molar  40/10 reported on pain scale pt lying on the stretcher with her head in her hands  - crying, moaning  "Please get me something now for my pain  - I hurt so bad. I lost a filing and I started hurting yesterday."

## 2015-12-12 NOTE — Discharge Instructions (Signed)
Follow up with the dentist within 2 weeks. Return to the ER for symptoms that change or worsen if unable to schedule an appointment.

## 2015-12-12 NOTE — ED Provider Notes (Signed)
[facility] Emergency Department Provider Note ____________________________________________  Time seen: Approximately 7:10 AM  I have reviewed the triage vital signs and the nursing notes.   HISTORY  Chief Complaint Dental Pain   HPI CHRISTL FESSENDEN is a 50 y.o. female who presents to the emergency department for evaluation of dental pain. She states that shelost a filling about a month ago, but it didn't start hurting until last night. She has taken ibuprofen without relief.  Past Medical History:  Diagnosis Date  . COPD (chronic obstructive pulmonary disease) (HCC)     There are no active problems to display for this patient.   Past Surgical History:  Procedure Laterality Date  . hemoroidectomy  2003/2002  . tubiligation      Prior to Admission medications   Medication Sig Start Date End Date Taking? Authorizing Provider  amoxicillin (AMOXIL) 500 MG tablet Take 1 tablet (500 mg total) by mouth 3 (three) times daily. 12/12/15   Chinita Pester, FNP  ibuprofen (ADVIL,MOTRIN) 800 MG tablet Take breakfast, lunch and dinner with food on stomache 05/11/11   Bethany Hunt, PA-C  traMADol (ULTRAM) 50 MG tablet Take 1 tablet (50 mg total) by mouth every 6 (six) hours as needed. 12/12/15   Chinita Pester, FNP    Allergies Vicodin [hydrocodone-acetaminophen]  Family History  Problem Relation Age of Onset  . Diabetes Mother   . Hyperlipidemia Mother   . Hypertension Mother     Social History Social History  Substance Use Topics  . Smoking status: Current Every Day Smoker    Packs/day: 0.50    Years: 25.00    Types: Cigarettes  . Smokeless tobacco: Never Used  . Alcohol use No    Review of Systems Constitutional: Uncomfortable appearing.  ENT: Positive for dental pain Musculoskeletal: Negative for jaw pain/trismus. Skin: Negative for swelling or erythema. ____________________________________________   PHYSICAL EXAM:  VITAL SIGNS: ED Triage  Vitals  Enc Vitals Group     BP 12/12/15 0651 (!) 191/97     Pulse Rate 12/12/15 0651 (!) 58     Resp 12/12/15 0651 20     Temp 12/12/15 0651 97.4 F (36.3 C)     Temp Source 12/12/15 0651 Oral     SpO2 12/12/15 0651 100 %     Weight 12/12/15 0651 105 lb (47.6 kg)     Height 12/12/15 0651  (1.575 m)     Head Circumference --      Peak Flow --      Pain Score 12/12/15 0653 10     Pain Loc --      Pain Edu? --      Excl. in GC? --     Constitutional: Alert and oriented. Well appearing and in no acute distress. Eyes: Conjunctivae are normal.EOMI. Mouth/Throat: Mucous membranes are moist. Oropharynx non-erythematous. Periodontal Exam    Hematological/Lymphatic/Immunilogical: No cervical lymphadenopathy. Respiratory: Normal respiratory effort.  Musculoskeletal: Full ROM x 4 extremities. Neurologic:  Normal speech and language. No gross focal neurologic deficits are appreciated. Speech is normal. No gait instability. Skin:  No facial swelling or erythema. Psychiatric: Mood and affect are normal. Speech and behavior are normal.  ____________________________________________   LABS (all labs ordered are listed, but only abnormal results are displayed)  Labs Reviewed - No data to display ____________________________________________   RADIOLOGY  Not indicated.  ____________________________________________   PROCEDURES  Procedure(s) performed: None  Critical Care performed: No  ____________________________________________   INITIAL IMPRESSION / ASSESSMENT  AND PLAN / ED COURSE  Pertinent labs & imaging results that were available during my care of the patient were reviewed by me and considered in my medical decision making (see chart for details). Patient will be prescribed Amoxicillin and Tramadol. Patient was advised to see the dentist within 14 days. Also advised to take the antibiotic until finished. Instructed to return to the ER for symptoms that change or  worsen if you are unable to schedule an appointment. ____________________________________________   FINAL CLINICAL IMPRESSION(S) / ED DIAGNOSES  Final diagnoses:  Pain, dental    Note:  This document was prepared using Dragon voice recognition software and may include unintentional dictation errors.    Chinita PesterCari B Everlie Eble, FNP 12/12/15 1557    Gayla DossEryka A Gayle, MD 12/13/15 2056

## 2015-12-12 NOTE — ED Triage Notes (Signed)
Pt ambulatory to triage, report dental abscess to upper right tooth, reports filling fell out about a month ago.  Pt anxious, resp. Equal and unlabored, skin warm and dry.

## 2018-12-28 ENCOUNTER — Emergency Department
Admission: EM | Admit: 2018-12-28 | Discharge: 2018-12-28 | Disposition: A | Discharge status: Home / Self Care | Payer: Self-pay | Attending: Emergency Medicine | Admitting: Emergency Medicine

## 2018-12-28 ENCOUNTER — Other Ambulatory Visit: Payer: Self-pay

## 2018-12-28 DIAGNOSIS — F17210 Nicotine dependence, cigarettes, uncomplicated: Secondary | ICD-10-CM | POA: Insufficient documentation

## 2018-12-28 DIAGNOSIS — Z79899 Other long term (current) drug therapy: Secondary | ICD-10-CM | POA: Insufficient documentation

## 2018-12-28 DIAGNOSIS — J449 Chronic obstructive pulmonary disease, unspecified: Secondary | ICD-10-CM | POA: Insufficient documentation

## 2018-12-28 DIAGNOSIS — H9201 Otalgia, right ear: Secondary | ICD-10-CM | POA: Insufficient documentation

## 2018-12-28 NOTE — ED Provider Notes (Signed)
[facility] Emergency Department Provider Note ____________________________________________  Time seen: 85  I have reviewed the triage vital signs and the nursing notes.  HISTORY  Chief Complaint  Otalgia and Headache  HPI Maureen Cook is a 53 y.o. female presents to the ED for evaluation of minimal right ear pain.  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  She denies any dizziness, vertigo, nausea vomiting, dizziness but she does admit to being perimenopausal, and has been having hot flashes as well.  She believes combination of the un-air conditioned warehouse as well as the high humidity is causing her some discomfort.  She presents for evaluation of her ear pain, is requesting a release to return to work at the request of her employer.  She denies any chest pain, shortness of breath, diaphoresis, nausea, vomiting, or syncope.  Past Medical History:  Diagnosis Date  . COPD (chronic obstructive pulmonary disease) (HCC)     There are no active problems to display for this patient.   Past Surgical History:  Procedure Laterality Date  . hemoroidectomy  2003/2002  . tubiligation      Prior to Admission medications   Medication Sig Start Date End Date Taking? Authorizing Provider  amoxicillin (AMOXIL) 500 MG tablet Take 1 tablet (500 mg total) by mouth 3 (three) times daily. 12/12/15   Triplett, Dessa Phi, FNP  ibuprofen (ADVIL,MOTRIN) 800 MG tablet Take breakfast, lunch and dinner with food on stomache 05/11/11   Hunt, Bethany, PA-C  traMADol (ULTRAM) 50 MG tablet Take 1 tablet (50 mg total) by mouth every 6 (six) hours as needed. 12/12/15   Victorino Dike, FNP    Allergies Vicodin [hydrocodone-acetaminophen]  Family History  Problem Relation Age of Onset  . Diabetes Mother   . Hyperlipidemia Mother   . Hypertension Mother     Social History Social History   Tobacco Use  . Smoking status: Current Every Day Smoker     Packs/day: 0.50    Years: 25.00    Pack years: 12.50    Types: Cigarettes  . Smokeless tobacco: Never Used  Substance Use Topics  . Alcohol use: No  . Drug use: No    Review of Systems  Constitutional: Negative for fever. Eyes: Negative for visual changes. ENT: Negative for sore throat.  Right otalgia as above. Cardiovascular: Negative for chest pain. Respiratory: Negative for shortness of breath. Gastrointestinal: Negative for abdominal pain, vomiting and diarrhea. Genitourinary: Negative for dysuria. Musculoskeletal: Negative for back pain. Skin: Negative for rash. Neurological: Negative for headaches, focal weakness or numbness. ____________________________________________  PHYSICAL EXAM:  VITAL SIGNS: ED Triage Vitals  Enc Vitals Group     BP 12/28/18 1710 (!) 104/57     Pulse Rate 12/28/18 1710 (!) 58     Resp 12/28/18 1710 18     Temp 12/28/18 1710 98.1 F (36.7 C)     Temp src --      SpO2 12/28/18 1710 99 %     Weight 12/28/18 1711 122 lb (55.3 kg)     Height 12/28/18 1711 5\' 2"  (1.575 m)     Head Circumference --      Peak Flow --      Pain Score 12/28/18 1711 0     Pain Loc --      Pain Edu? --      Excl. in [place]? --     Constitutional: Alert and oriented. Well appearing and in no distress. Head:  Normocephalic and atraumatic. Eyes: Conjunctivae are normal. PERRL. Normal extraocular movements Ears: Canals clear. TMs intact bilaterally.  Right TM is intact without any erythema, bulging, or purulent effusion noted.  Patient with a mild serous effusion appreciated. Nose: No congestion/rhinorrhea/epistaxis. Mouth/Throat: Mucous membranes are moist. Neck: Supple. No thyromegaly. Hematological/Lymphatic/Immunological: No cervical lymphadenopathy. Cardiovascular: Normal rate, regular rhythm. Normal distal pulses. Respiratory: Normal respiratory effort. No wheezes/rales/rhonchi. Musculoskeletal: Nontender with normal range of motion in all extremities.   Neurologic:  Normal gait without ataxia. Normal speech and language. No gross focal neurologic deficits are appreciated. Skin:  Skin is warm, dry and intact. No rash noted. ____________________________________________  PROCEDURES  Procedures ____________________________________________  INITIAL IMPRESSION / ASSESSMENT AND PLAN / ED COURSE  Lynnell GrainDonna L Polhamus was evaluated in Emergency Department on 12/28/2018 for the symptoms described in the history of present illness. She was evaluated in the context of the global COVID-19 pandemic, which necessitated consideration that the patient might be at risk for infection with the SARS-CoV-2 virus that causes COVID-19. Institutional protocols and algorithms that pertain to the evaluation of patients at risk for COVID-19 are in a state of rapid change based on information released by regulatory bodies including the CDC and federal and state organizations. These policies and algorithms were followed during the patient's care in the ED.  Patient with ED evaluation of a 1 day complaint of intermittent right ear pain.  Patient is wanted to rule out a acute ear infection.  Exam is overall benign reassuring and shows no acute otitis media.  She may have a mild serous effusion on exam.  She is encouraged to take over-the-counter Benadryl and Flonase for symptom relief.  She is referred to one of the local community clinics for ongoing symptom management.  She is given a work note to clear her to return to work tomorrow as per plan. ____________________________________________  FINAL CLINICAL IMPRESSION(S) / ED DIAGNOSES  Final diagnoses:  Right ear pain      Karmen StabsMenshew, Charlesetta IvoryJenise V Bacon, PA-C 12/28/18 [place] [id number]    Paduchowski, Kevin, MD 12/28/18 2033

## 2018-12-28 NOTE — ED Triage Notes (Signed)
Pt comes via POV from home with c/o headache for about a week, pt also states pain in right ear.  Pt also states some imbalances because of her ear pain.  Pt states she just needs to get checked out so that she can go back to work.  Pt denies any CP, SOB and abdominal pain.

## 2018-12-28 NOTE — Discharge Instructions (Signed)
Your exam is normal at this time. There is no evidence of infection. Take OTC Benadryl or Tylenol as needed. Follow-up with [facility] as needed.

## 2020-03-17 ENCOUNTER — Emergency Department: Payer: Medicaid Other

## 2020-03-17 ENCOUNTER — Emergency Department
Admission: EM | Admit: 2020-03-17 | Discharge: 2020-03-17 | Disposition: A | Discharge status: Home / Self Care | Payer: Medicaid Other | Attending: Emergency Medicine | Admitting: Emergency Medicine

## 2020-03-17 DIAGNOSIS — J449 Chronic obstructive pulmonary disease, unspecified: Secondary | ICD-10-CM | POA: Diagnosis not present

## 2020-03-17 DIAGNOSIS — R079 Chest pain, unspecified: Secondary | ICD-10-CM | POA: Diagnosis present

## 2020-03-17 DIAGNOSIS — F419 Anxiety disorder, unspecified: Secondary | ICD-10-CM

## 2020-03-17 DIAGNOSIS — F17210 Nicotine dependence, cigarettes, uncomplicated: Secondary | ICD-10-CM | POA: Diagnosis not present

## 2020-03-17 LAB — CBC WITH DIFFERENTIAL/PLATELET
Abs Immature Granulocytes: 0.02 10*3/uL (ref 0.00–0.07)
Basophils Absolute: 0 10*3/uL (ref 0.0–0.1)
Basophils Relative: 0 %
Eosinophils Absolute: 0.1 10*3/uL (ref 0.0–0.5)
Eosinophils Relative: 1 %
HCT: 44.8 % (ref 36.0–46.0)
Hemoglobin: 14.8 g/dL (ref 12.0–15.0)
Immature Granulocytes: 0 %
Lymphocytes Relative: 10 %
Lymphs Abs: 0.7 10*3/uL (ref 0.7–4.0)
MCH: 29.4 pg (ref 26.0–34.0)
MCHC: 33 g/dL (ref 30.0–36.0)
MCV: 89.1 fL (ref 80.0–100.0)
Monocytes Absolute: 0.6 10*3/uL (ref 0.1–1.0)
Monocytes Relative: 8 %
Neutro Abs: 6.2 10*3/uL (ref 1.7–7.7)
Neutrophils Relative %: 81 %
Platelets: 175 10*3/uL (ref 150–400)
RBC: 5.03 MIL/uL (ref 3.87–5.11)
RDW: 13.6 % (ref 11.5–15.5)
WBC: 7.6 10*3/uL (ref 4.0–10.5)
nRBC: 0 % (ref 0.0–0.2)

## 2020-03-17 LAB — COMPREHENSIVE METABOLIC PANEL
ALT: 10 U/L (ref 0–44)
AST: 15 U/L (ref 15–41)
Albumin: 3.7 g/dL (ref 3.5–5.0)
Alkaline Phosphatase: 87 U/L (ref 38–126)
Anion gap: 8 (ref 5–15)
BUN: 13 mg/dL (ref 6–20)
CO2: 25 mmol/L (ref 22–32)
Calcium: 9 mg/dL (ref 8.9–10.3)
Chloride: 107 mmol/L (ref 98–111)
Creatinine, Ser: 1.01 mg/dL — ABNORMAL HIGH (ref 0.44–1.00)
GFR, Estimated: >60 mL/min (ref >60)
Glucose, Bld: 103 mg/dL — ABNORMAL HIGH (ref 70–99)
Potassium: 4.4 mmol/L (ref 3.5–5.1)
Sodium: 140 mmol/L (ref 135–145)
Total Bilirubin: 0.9 mg/dL (ref 0.3–1.2)
Total Protein: 6.5 g/dL (ref 6.5–8.1)

## 2020-03-17 LAB — TROPONIN I (HIGH SENSITIVITY)
Troponin I (High Sensitivity): 4 ng/L (ref <18)
Troponin I (High Sensitivity): 4 ng/L (ref <18)

## 2020-03-17 IMAGING — CR DG CHEST 2V
1 series · 2 of 2 positions shown · non-contrast
Comparison: [DATE]

CLINICAL DATA: Chest pain

EXAM:
CHEST - 2 VIEW

[Series 1: dg chest 2 view · 0.14mm/px · 2 of 2 slices shown]
[im 1/2]
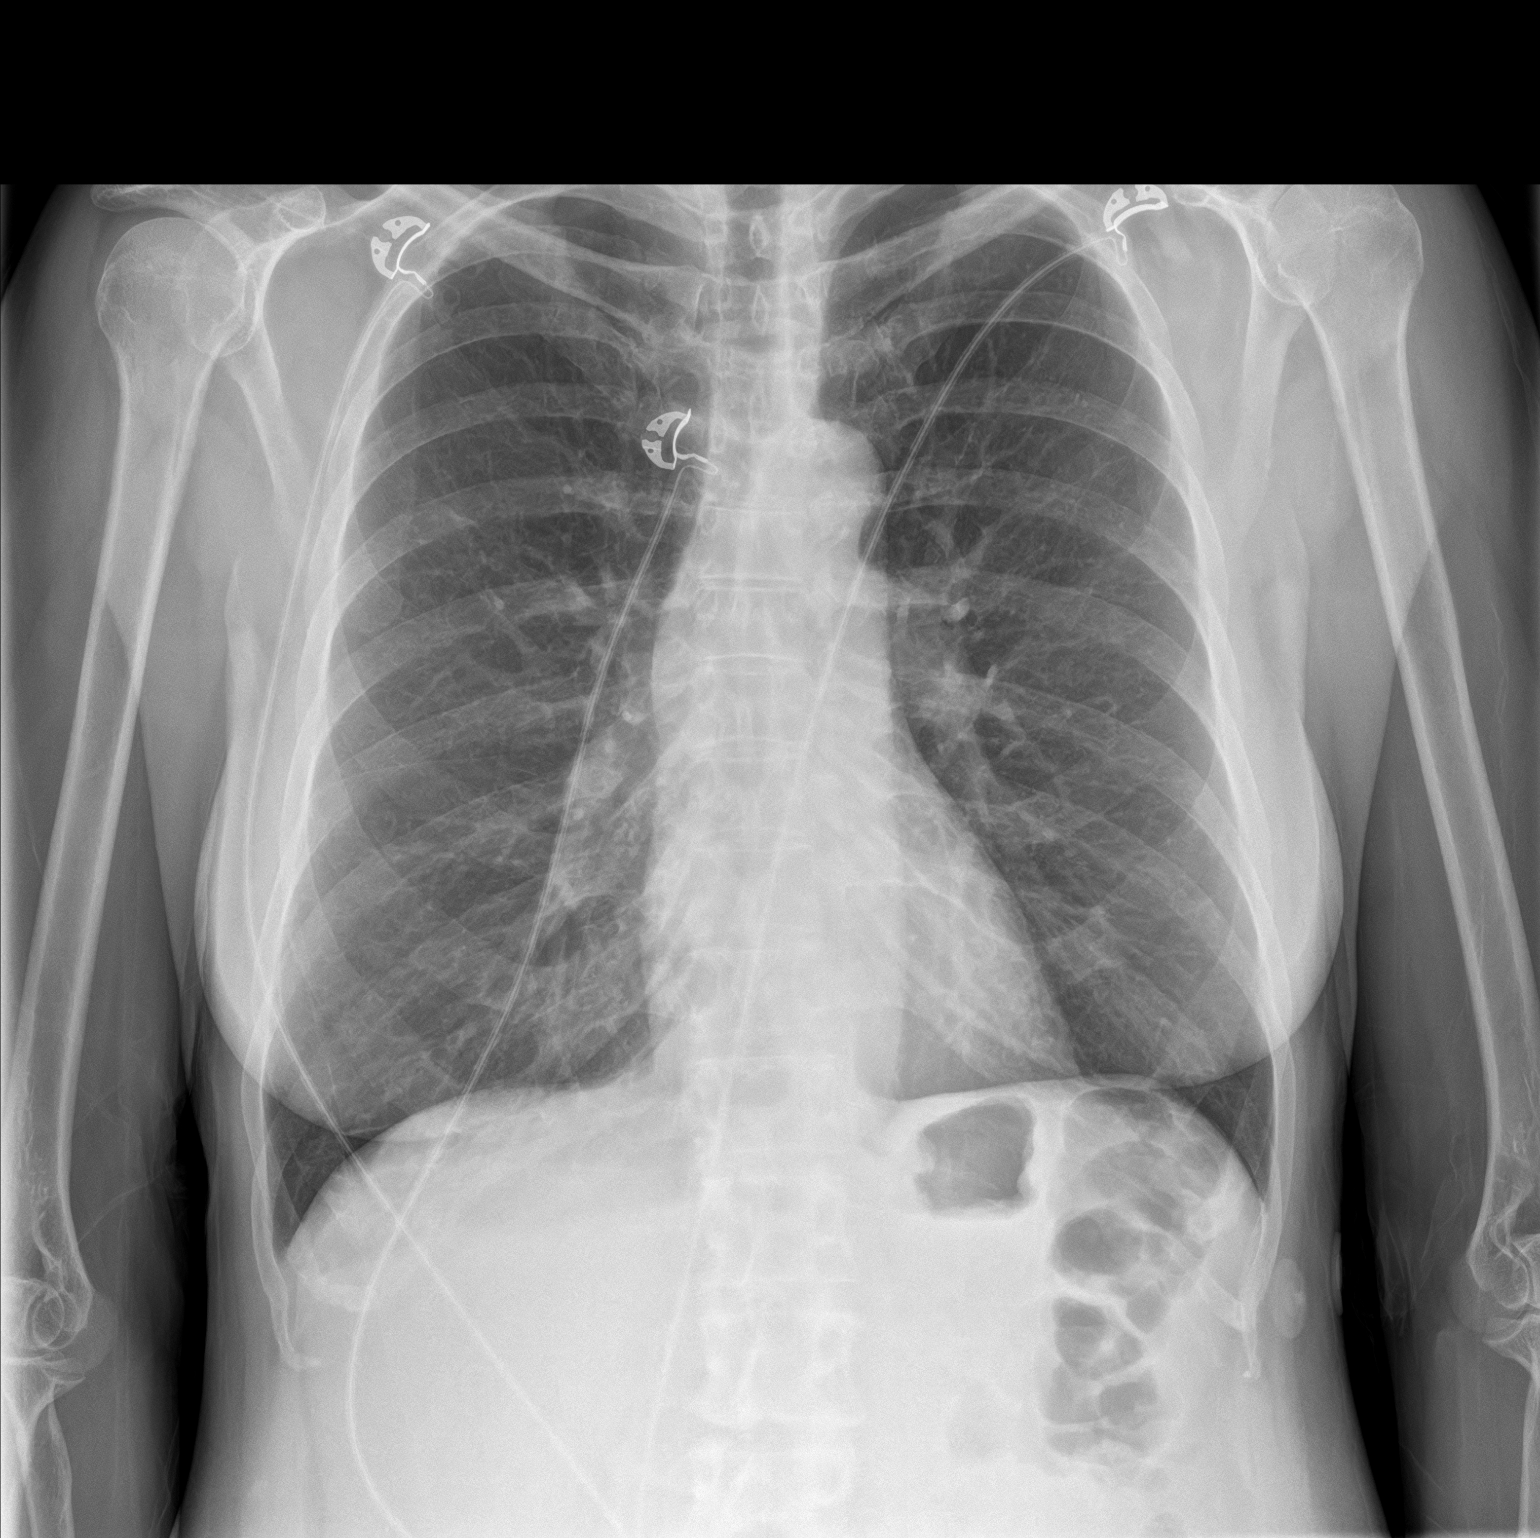
[im 2/2]
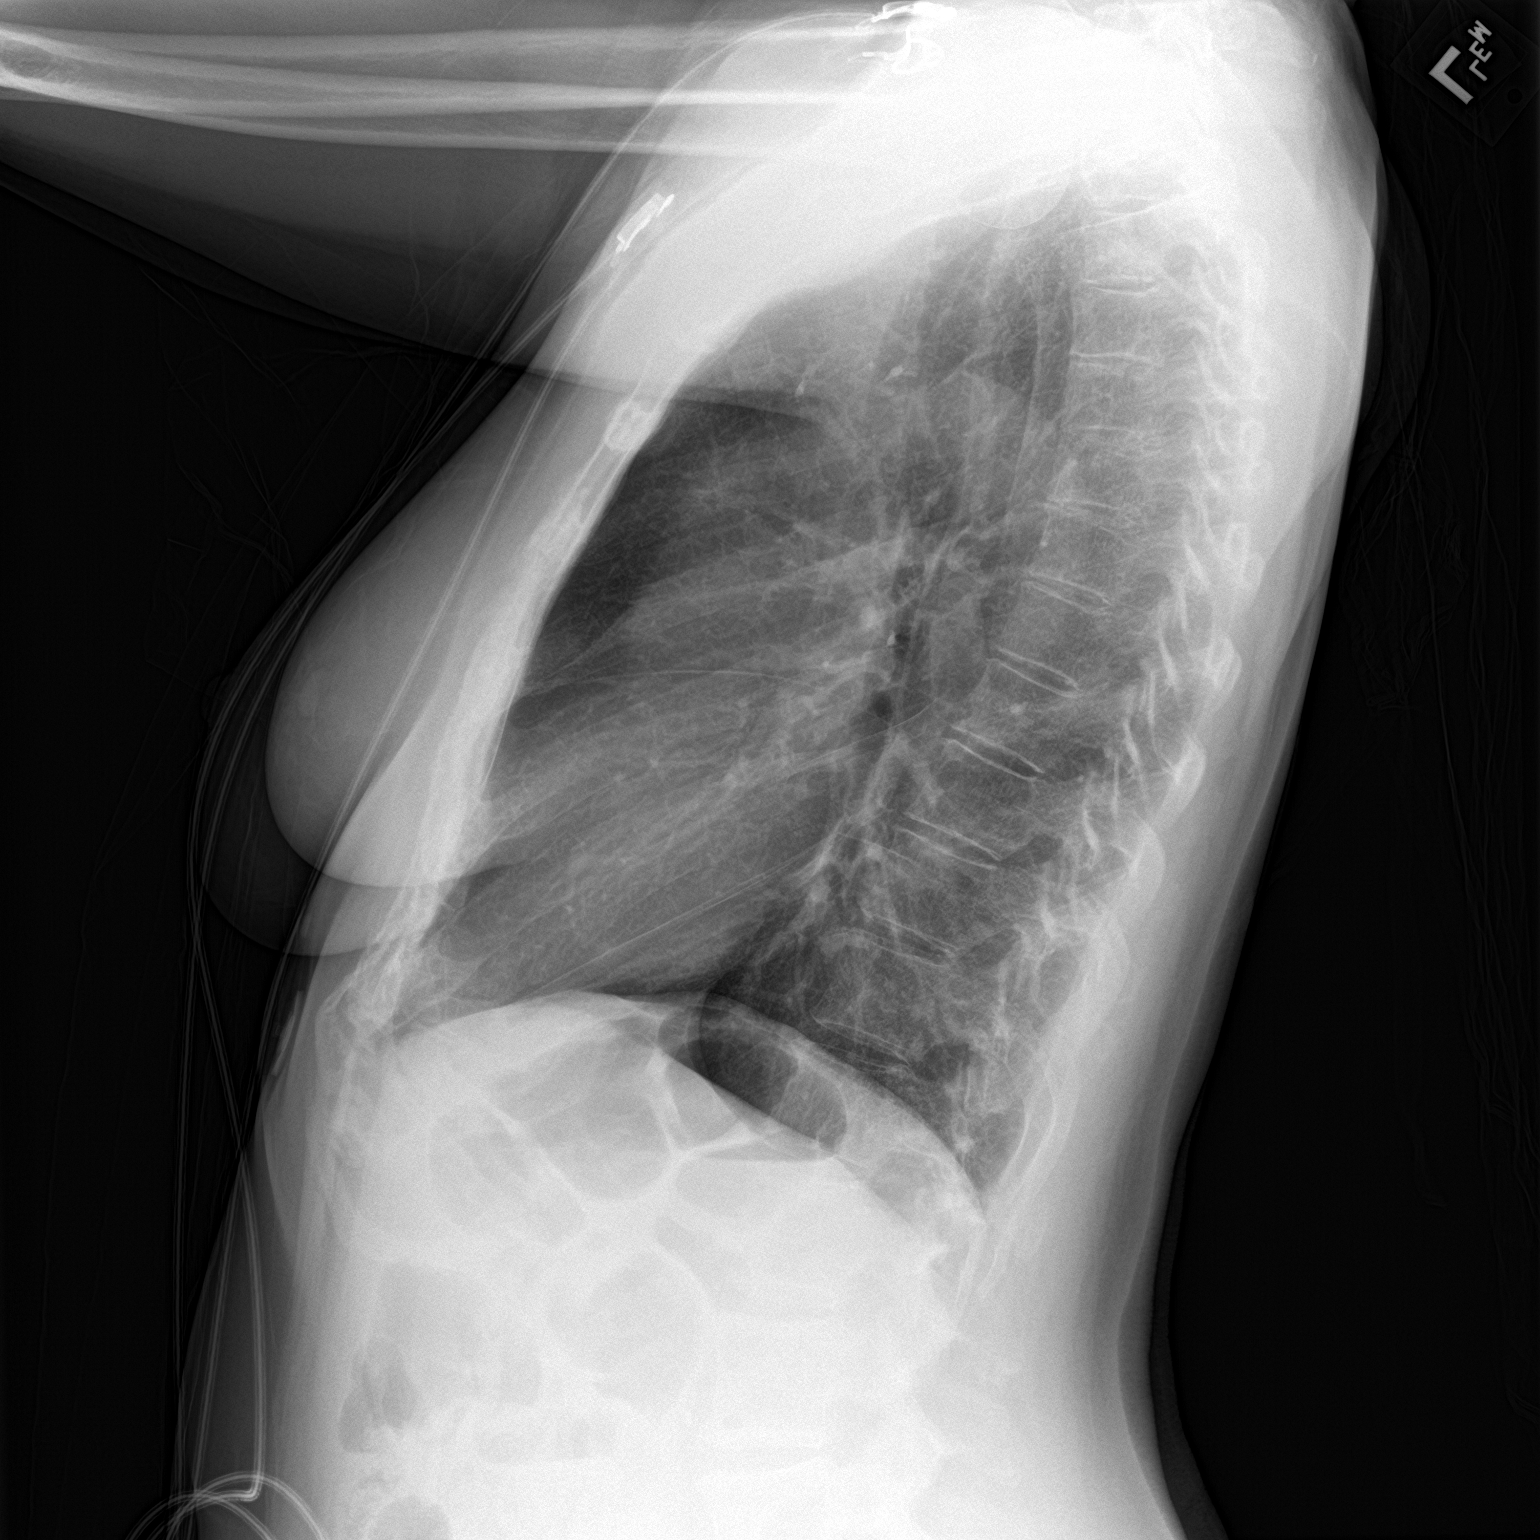

[2 of 2 positions shown; findings below may reference images not displayed]

FINDINGS: The heart size and mediastinal contours are within normal limits.
Both lungs are clear. The visualized skeletal structures are
unremarkable.
IMPRESSION: No active cardiopulmonary disease.

## 2020-03-17 MED ORDER — DIAZEPAM 2 MG PO TABS: 2.0000 mg | ORAL_TABLET | Freq: Three times a day (TID) | ORAL | 0 refills | Status: DC | PRN

## 2020-03-17 NOTE — Discharge Instructions (Signed)
Her blood work and EKG look okay.  It is likely that your chest pain may be due to anxiety.  Just in case I would like you to follow-up with a cardiologist.  Dr. Vennie Homans is on call today.  He is very good.  If you call his office up and let them know that you were in the emergency room with chest pain they should be out of see you fairly quickly.  Please return here if you get worse pain or shortness of breath fever or any other complaints.  I have given you a small amount of Valium which you can take up to 3 times a day if needed for severe anxiety.  Try not to take it too frequently as you will run out.

## 2020-03-17 NOTE — ED Provider Notes (Signed)
[facility] Emergency Department Provider Note   ____________________________________________   First MD Initiated Contact with Patient 03/17/20 1640     (approximate)  I have reviewed the triage vital signs and the nursing notes.   HISTORY  Chief Complaint No chief complaint on file. Chief complaint is chest pain  HPI Maureen Cook is a 54 y.o. female who reports she is just come down from [place] staying with family.  She is having chest pain.  It started last night.  It does not happen when she takes a deep breath or if she exercises but it happens when she thinks of stressors that are going on with her family starts out sharp and gets better after 5 minutes and goes away.  This happens over and over again.  He can be moderately severe just in the middle of her chest nothing seems to make it better or worse         Past Medical History:  Diagnosis Date  . COPD (chronic obstructive pulmonary disease) (HCC)     There are no problems to display for this patient.   Past Surgical History:  Procedure Laterality Date  . hemoroidectomy  2003/2002  . tubiligation      Prior to Admission medications   Medication Sig Start Date End Date Taking? Authorizing Provider  amoxicillin (AMOXIL) 500 MG tablet Take 1 tablet (500 mg total) by mouth 3 (three) times daily. 12/12/15   Triplett, Kasandra Knudsen, FNP  ibuprofen (ADVIL,MOTRIN) 800 MG tablet Take breakfast, lunch and dinner with food on stomache 05/11/11   Hunt, Bethany, PA-C  traMADol (ULTRAM) 50 MG tablet Take 1 tablet (50 mg total) by mouth every 6 (six) hours as needed. 12/12/15   Chinita Pester, FNP    Allergies Vicodin [hydrocodone-acetaminophen]  Family History  Problem Relation Age of Onset  . Diabetes Mother   . Hyperlipidemia Mother   . Hypertension Mother     Social History Social History   Tobacco Use  . Smoking status: Current Every Day Smoker    Packs/day: 0.50    Years: 25.00    Pack  years: 12.50    Types: Cigarettes  . Smokeless tobacco: Never Used  Substance Use Topics  . Alcohol use: No  . Drug use: No    Review of Systems Currently: Constitutional: No fever/chills Eyes: No visual changes. ENT: No sore throat. Cardiovascular: Denies chest pain. Respiratory: Denies shortness of breath. Gastrointestinal: No abdominal pain.  No nausea, no vomiting.  No diarrhea.  No constipation. Genitourinary: Negative for dysuria. Musculoskeletal: Negative for back pain. Skin: Negative for rash. Neurological: Negative for headaches, focal weakness or   ____________________________________________   PHYSICAL EXAM:  VITAL SIGNS: ED Triage Vitals  Enc Vitals Group     BP      Pulse      Resp      Temp      Temp src      SpO2      Weight      Height      Head Circumference      Peak Flow      Pain Score      Pain Loc      Pain Edu?    Constitutional: Alert and oriented.  Tearful Eyes: Conjunctivae are normal. PER Head: Atraumatic. Nose: No congestion/rhinnorhea. Mouth/Throat: Mucous membranes are moist.  Oropharynx non-erythematous. Neck: No stridor.  Cardiovascular: Normal rate, regular rhythm. Grossly normal heart sounds.  Good peripheral circulation.  Respiratory: Normal respiratory effort.  No retractions. Lungs CTAB. Gastrointestinal: Soft and nontender. No distention. No abdominal bruits.  Musculoskeletal: No lower extremity tenderness nor edema.   Neurologic:  Normal speech and language. No gross focal neurologic deficits are appreciated. No gait instability. Skin:  Skin is warm, dry and intact. No rash noted.   ____________________________________________   LABS (all labs ordered are listed, but only abnormal results are displayed)  Labs Reviewed  COMPREHENSIVE METABOLIC PANEL - Abnormal; Notable for the following components:      Result Value   Glucose, Bld 103 (*)    Creatinine, Ser 1.01 (*)    All other components within normal limits    CBC WITH DIFFERENTIAL/PLATELET  TROPONIN I (HIGH SENSITIVITY)   ____________________________________________  EKG  EKG read interpreted by me shows normal sinus rhythm rate of 68 normal axis nonspecific ST-T wave changes ____________________________________________  RADIOLOGY Jill Poling, personally viewed and evaluated these images (plain radiographs) as part of my medical decision making, as well as reviewing the written report by the radiologist.  ED MD interpretation: Chest x-ray read by radiology reviewed by me shows no acute disease  Official radiology report(s): No results found.  ____________________________________________   PROCEDURES  Procedure(s) performed (including Critical Care):  Procedures   ____________________________________________   INITIAL IMPRESSION / ASSESSMENT AND PLAN / ED COURSE  Patient symptoms are not really consistent with cardiac origin more likely to be angina however she is old enough that she could have angina.  She is a smoker and has a family history of diabetes and hypertension etc.  I will have her follow-up with cardiology just to double check.  She will return if she has any further problems.              ____________________________________________   FINAL CLINICAL IMPRESSION(S) / ED DIAGNOSES  Final diagnoses:  Chest pain     ED Discharge Orders    None      *Please note:  Maureen Cook was evaluated in Emergency Department on 03/17/2020 for the symptoms described in the history of present illness. She was evaluated in the context of the global COVID-19 pandemic, which necessitated consideration that the patient might be at risk for infection with the SARS-CoV-2 virus that causes COVID-19. Institutional protocols and algorithms that pertain to the evaluation of patients at risk for COVID-19 are in a state of rapid change based on information released by regulatory bodies including the CDC and federal and  state organizations. These policies and algorithms were followed during the patient's care in the ED.  Some ED evaluations and interventions may be delayed as a result of limited staffing during and the pandemic.*   Note:  This document was prepared using Dragon voice recognition software and may include unintentional dictation errors.    Arnaldo Natal, MD 03/17/20 [phone]

## 2020-03-17 NOTE — ED Notes (Signed)
Triage entered during down time.

## 2020-03-17 NOTE — ED Notes (Signed)
Pt to ED for c/o chest pain, then states "well not really, I have a lot going on at home with family, and I think it could be a panic attack". Pt is crying upon assessment. States "I think I just need to go back to [place]". States that there is a lot of stressors at home with family members. Currently denying CP/ SOB at this time.

## 2021-01-13 ENCOUNTER — Observation Stay
Admission: EM | Admit: 2021-01-13 | Discharge: 2021-01-14 | Disposition: A | Discharge status: Home / Self Care | Payer: Medicaid Other | Attending: Internal Medicine | Admitting: Internal Medicine

## 2021-01-13 ENCOUNTER — Observation Stay: Payer: Medicaid Other

## 2021-01-13 ENCOUNTER — Encounter: Payer: Self-pay | Admitting: Internal Medicine

## 2021-01-13 ENCOUNTER — Emergency Department: Payer: Medicaid Other

## 2021-01-13 ENCOUNTER — Other Ambulatory Visit: Payer: Self-pay

## 2021-01-13 DIAGNOSIS — J449 Chronic obstructive pulmonary disease, unspecified: Secondary | ICD-10-CM | POA: Diagnosis not present

## 2021-01-13 DIAGNOSIS — I639 Cerebral infarction, unspecified: Principal | ICD-10-CM | POA: Insufficient documentation

## 2021-01-13 DIAGNOSIS — Z720 Tobacco use: Secondary | ICD-10-CM

## 2021-01-13 DIAGNOSIS — R202 Paresthesia of skin: Secondary | ICD-10-CM

## 2021-01-13 DIAGNOSIS — M4802 Spinal stenosis, cervical region: Secondary | ICD-10-CM | POA: Diagnosis not present

## 2021-01-13 DIAGNOSIS — Z79899 Other long term (current) drug therapy: Secondary | ICD-10-CM | POA: Insufficient documentation

## 2021-01-13 DIAGNOSIS — Z20822 Contact with and (suspected) exposure to covid-19: Secondary | ICD-10-CM | POA: Insufficient documentation

## 2021-01-13 DIAGNOSIS — F1410 Cocaine abuse, uncomplicated: Secondary | ICD-10-CM | POA: Diagnosis present

## 2021-01-13 DIAGNOSIS — R200 Anesthesia of skin: Secondary | ICD-10-CM

## 2021-01-13 DIAGNOSIS — F419 Anxiety disorder, unspecified: Secondary | ICD-10-CM

## 2021-01-13 DIAGNOSIS — E042 Nontoxic multinodular goiter: Secondary | ICD-10-CM

## 2021-01-13 DIAGNOSIS — F17210 Nicotine dependence, cigarettes, uncomplicated: Secondary | ICD-10-CM | POA: Insufficient documentation

## 2021-01-13 HISTORY — DX: Tobacco use: Z72.0

## 2021-01-13 HISTORY — DX: Anxiety disorder, unspecified: F41.9

## 2021-01-13 LAB — LDL CHOLESTEROL, DIRECT: Direct LDL: 162 mg/dL — ABNORMAL HIGH (ref 0–99)

## 2021-01-13 LAB — COMPREHENSIVE METABOLIC PANEL
ALT: 29 U/L (ref 0–44)
AST: 13 U/L — ABNORMAL LOW (ref 15–41)
Albumin: 3.4 g/dL — ABNORMAL LOW (ref 3.5–5.0)
Alkaline Phosphatase: 75 U/L (ref 38–126)
Anion gap: 7 (ref 5–15)
BUN: 18 mg/dL (ref 6–20)
CO2: 28 mmol/L (ref 22–32)
Calcium: 9.1 mg/dL (ref 8.9–10.3)
Chloride: 105 mmol/L (ref 98–111)
Creatinine, Ser: 0.82 mg/dL (ref 0.44–1.00)
GFR, Estimated: >60 mL/min (ref >60)
Glucose, Bld: 94 mg/dL (ref 70–99)
Potassium: 4.4 mmol/L (ref 3.5–5.1)
Sodium: 140 mmol/L (ref 135–145)
Total Bilirubin: 0.6 mg/dL (ref 0.3–1.2)
Total Protein: 5.9 g/dL — ABNORMAL LOW (ref 6.5–8.1)

## 2021-01-13 LAB — CBC
HCT: 48.5 % — ABNORMAL HIGH (ref 36.0–46.0)
Hemoglobin: 16.1 g/dL — ABNORMAL HIGH (ref 12.0–15.0)
MCH: 29.9 pg (ref 26.0–34.0)
MCHC: 33.2 g/dL (ref 30.0–36.0)
MCV: 90 fL (ref 80.0–100.0)
Platelets: 262 10*3/uL (ref 150–400)
RBC: 5.39 MIL/uL — ABNORMAL HIGH (ref 3.87–5.11)
RDW: 14.3 % (ref 11.5–15.5)
WBC: 6.9 10*3/uL (ref 4.0–10.5)
nRBC: 0 % (ref 0.0–0.2)

## 2021-01-13 LAB — PROTIME-INR
INR: 0.9 (ref 0.8–1.2)
Prothrombin Time: 12.3 seconds (ref 11.4–15.2)

## 2021-01-13 LAB — URINE DRUG SCREEN, QUALITATIVE (ARMC ONLY)
Amphetamines, Ur Screen: NOT DETECTED
Barbiturates, Ur Screen: NOT DETECTED
Benzodiazepine, Ur Scrn: NOT DETECTED
Cannabinoid 50 Ng, Ur ~~LOC~~: NOT DETECTED
Cocaine Metabolite,Ur ~~LOC~~: POSITIVE — AB
MDMA (Ecstasy)Ur Screen: NOT DETECTED
Methadone Scn, Ur: NOT DETECTED
Opiate, Ur Screen: NOT DETECTED
Phencyclidine (PCP) Ur S: NOT DETECTED
Tricyclic, Ur Screen: NOT DETECTED

## 2021-01-13 LAB — APTT: aPTT: 26 seconds (ref 24–36)

## 2021-01-13 LAB — DIFFERENTIAL
Abs Immature Granulocytes: 0.04 10*3/uL (ref 0.00–0.07)
Basophils Absolute: 0.1 10*3/uL (ref 0.0–0.1)
Basophils Relative: 1 %
Eosinophils Absolute: 0.2 10*3/uL (ref 0.0–0.5)
Eosinophils Relative: 3 %
Immature Granulocytes: 1 %
Lymphocytes Relative: 37 %
Lymphs Abs: 2.5 10*3/uL (ref 0.7–4.0)
Monocytes Absolute: 0.6 10*3/uL (ref 0.1–1.0)
Monocytes Relative: 9 %
Neutro Abs: 3.5 10*3/uL (ref 1.7–7.7)
Neutrophils Relative %: 49 %

## 2021-01-13 LAB — HEMOGLOBIN A1C
Hgb A1c MFr Bld: 5 % (ref 4.8–5.6)
Mean Plasma Glucose: 96.8 mg/dL

## 2021-01-13 LAB — RESP PANEL BY RT-PCR (FLU A&B, COVID) ARPGX2
Influenza A by PCR: NEGATIVE
Influenza B by PCR: NEGATIVE
SARS Coronavirus 2 by RT PCR: NEGATIVE

## 2021-01-13 IMAGING — MR MR HEAD W/O CM
11 series · 44 of 48 positions shown · non-contrast
Comparison: Head CT without contrast [SQ] hours today. Cervical
spine MRI reported separately.

CLINICAL DATA: 55-year-old female with right face and hand numbness
and tingling for 1 day. TIA. No known injury.

EXAM:
MRI HEAD WITHOUT CONTRAST
TECHNIQUE: Multiplanar, multiecho pulse sequences of the brain and surrounding
structures were obtained without intravenous contrast.

[Series 10: ax dwi_tracew · axial · 3.0mm · 0.65mm/px · z∈[-142,+13]mm · 6 of 100 slices shown]
[im 1/100]
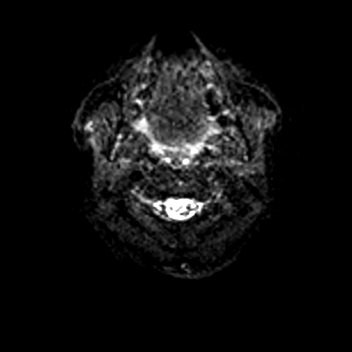
[im 20/100]
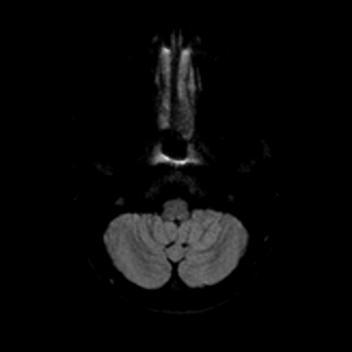
[im 40/100]
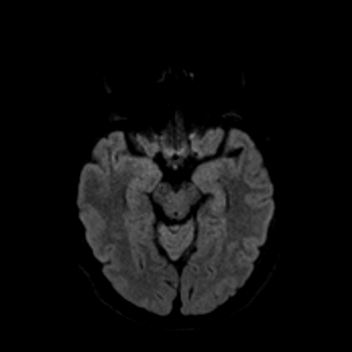
[im 60/100]
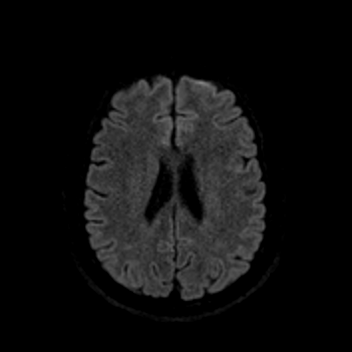
[im 80/100]
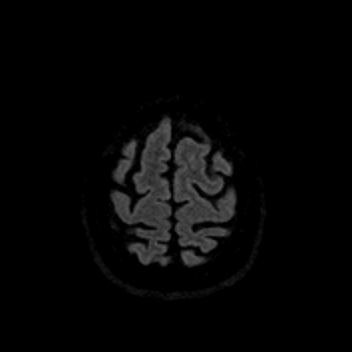
[im 100/100]
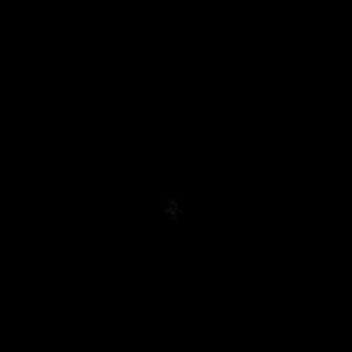

[Series 11: ax dwi_adc · axial · 3.0mm · 0.65mm/px · z∈[-142,+13]mm · 2 of 50 slices shown]
[im 1/50]
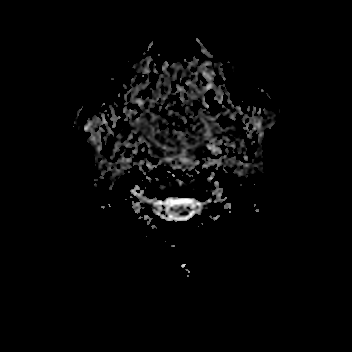
[im 50/50]
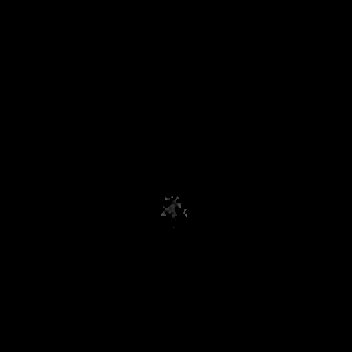

[Series 12: cor dwi_tracew · coronal · 5.0mm · 0.60mm/px · 5 of 80 slices shown]
[im 1/80]
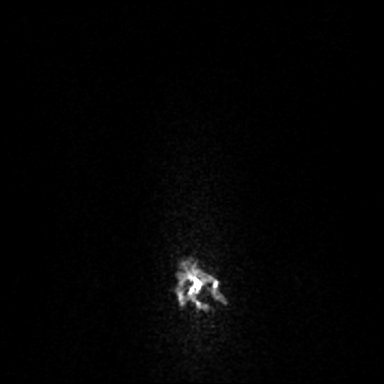
[im 20/80]
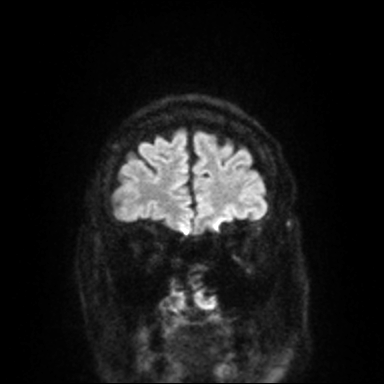
[im 40/80]
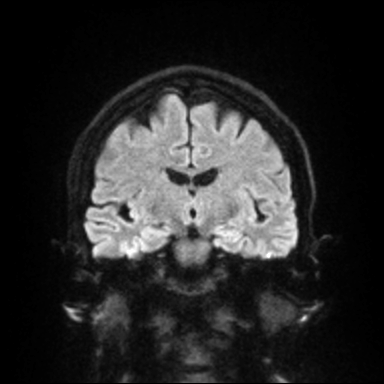
[im 60/80]
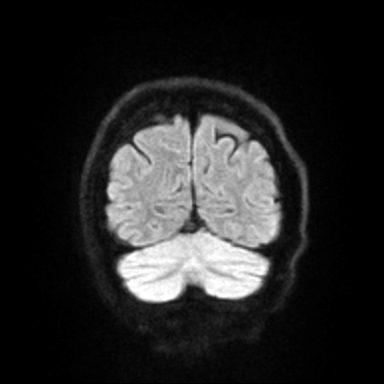
[im 80/80]
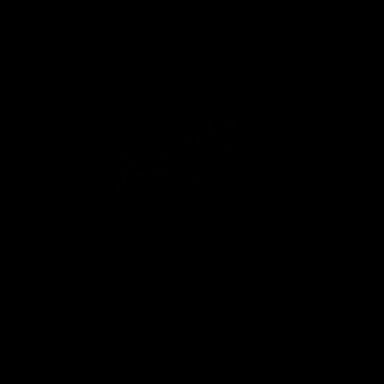

[Series 13: cor dwi_adc · coronal · 5.0mm · 0.60mm/px · 3 of 38 slices shown]
[im 1/38]
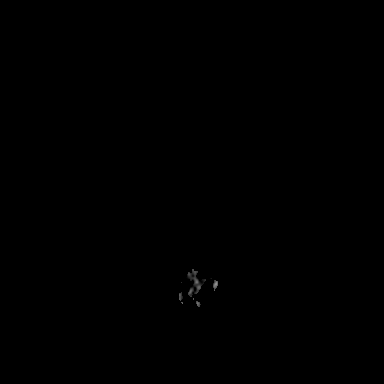
[im 19/38]
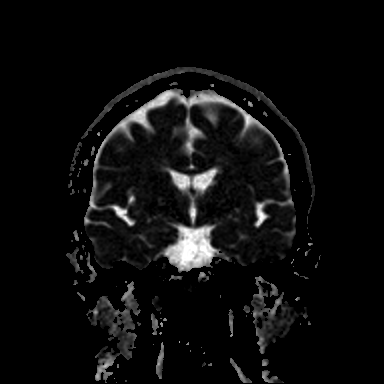
[im 38/38]
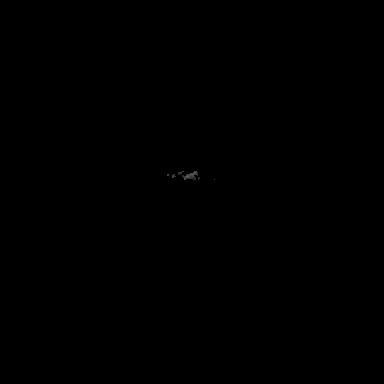

[Series 14: T1 · sagittal · 5.0mm · 0.62mm/px · 2 of 24 slices shown (1 of 2)]
[im 1/24]
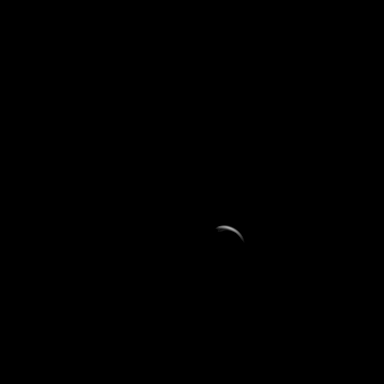
[im 24/24]
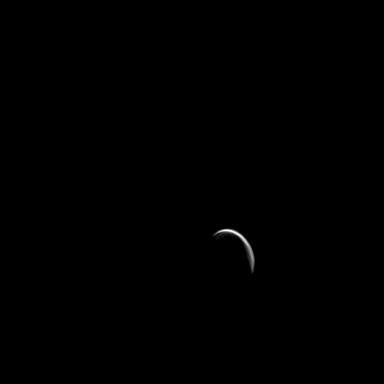

[Series 15: T2 · axial · 5.0mm · 0.53mm/px · z∈[-141,+14]mm · 2 of 28 slices shown]
[im 1/28]
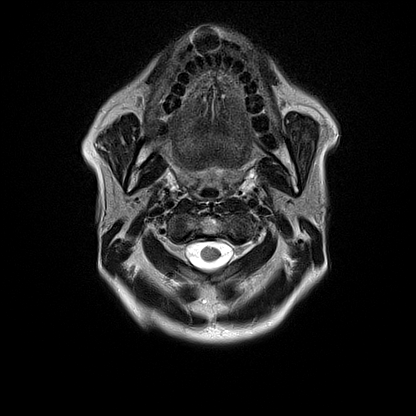
[im 28/28]
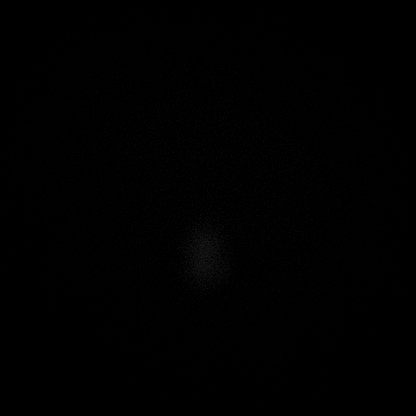

[Series 16: mag_images · axial · 3.0mm · 0.90mm/px · z∈[-149,+19]mm · 4 of 60 slices shown]
[im 1/60]
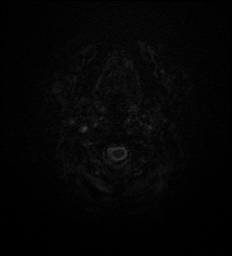
[im 20/60]
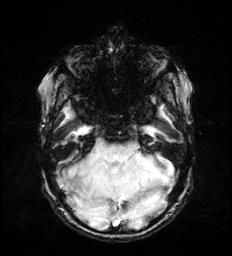
[im 40/60]
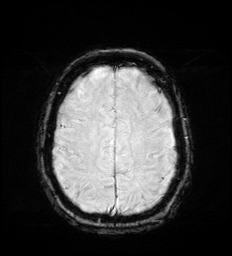
[im 60/60]
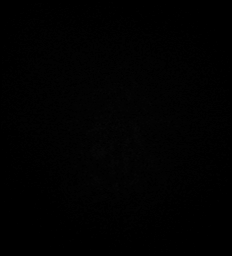

[Series 17: pha_images · axial · 3.0mm · 0.90mm/px · z∈[-149,+19]mm · 4 of 57 slices shown]
[im 1/57]
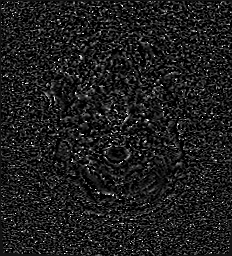
[im 19/57]
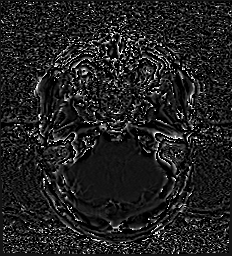
[im 38/57]
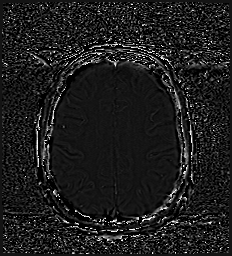
[im 57/57]
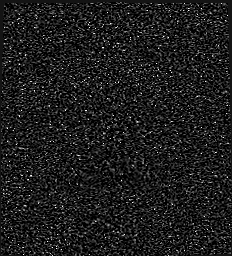

[Series 18: swi_images · axial · 3.0mm · 0.90mm/px · z∈[-149,-38]mm · 3 of 60 slices shown]
[im 1/60]
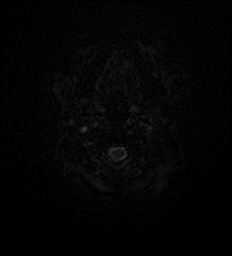
[im 20/60]
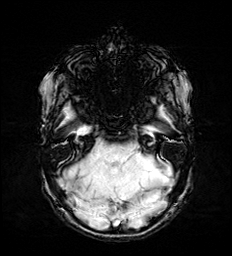
[im 40/60]
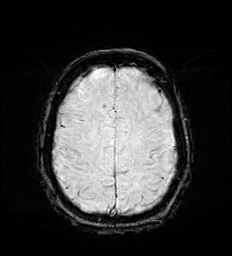

[Series 20: FLAIR · axial · 3.0mm · 0.53mm/px · z∈[-141,+14]mm · 4 of 55 slices shown]
[im 1/55]
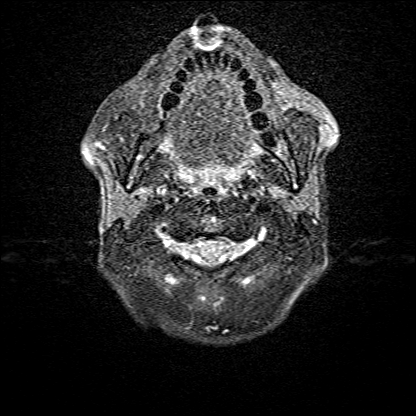
[im 19/55]
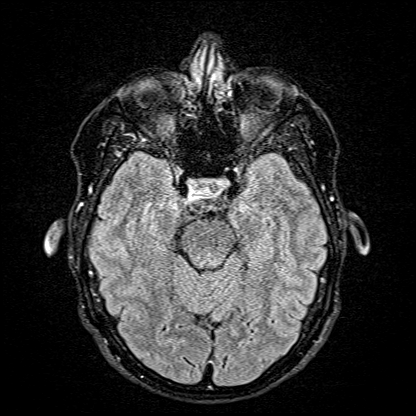
[im 37/55]
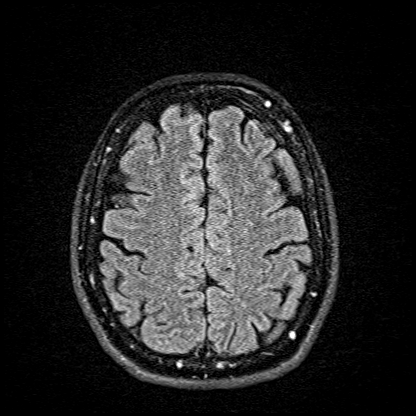
[im 55/55]
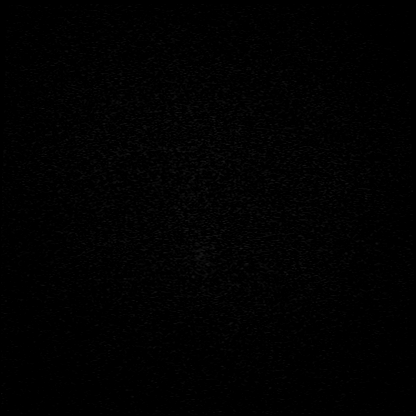

[Series 21: T1 · axial · 1.0mm · 0.98mm/px · z∈[-148,+19]mm · 9 of 176 slices shown (2 of 2)]
[im 1/176]
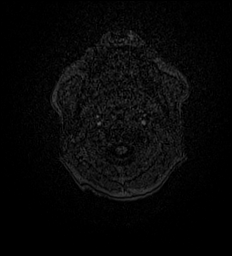
[im 32/176]
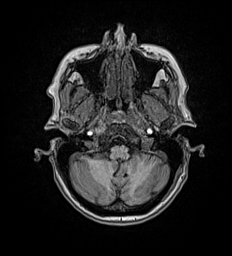
[im 48/176]
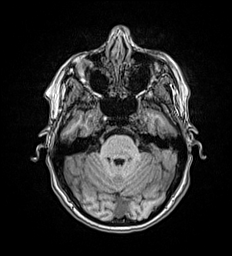
[im 80/176]
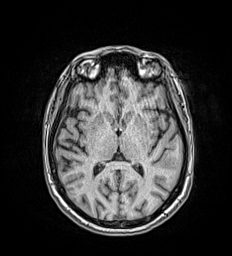
[im 96/176]
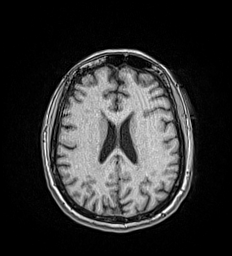
[im 128/176]
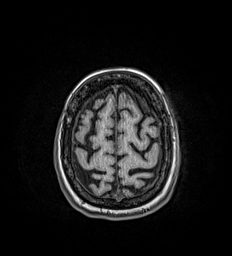
[im 144/176]
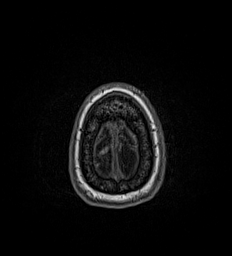
[im 160/176]
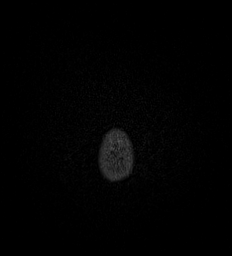
[im 176/176]
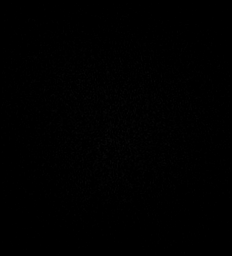

[44 of 48 positions shown; findings below may reference images not displayed]

FINDINGS: Brain: Punctate, 3 mm focus of restricted diffusion in the central
left thalamus (series 10, image 74). Mild associated T2 and FLAIR
hyperintensity. No hemorrhage or mass effect.

No other restricted diffusion. Small chronic lacunar infarcts in the
bilateral caudate nuclei, and small perivascular spaces versus
chronic lacune in the right lentiform. Mild superimposed additional
scattered nonspecific cerebral white matter T2 and FLAIR
hyperintensity. Brainstem and cerebellum appear negative. No chronic
cerebral blood products identified.

No midline shift, mass effect, evidence of mass lesion,
ventriculomegaly, extra-axial collection or acute intracranial
hemorrhage. Cervicomedullary junction and pituitary are within
normal limits.

Vascular: Major intracranial vascular flow voids are preserved.

Skull and upper cervical spine: Cervical spine detailed separately.
Visualized bone marrow signal is within normal limits.

Sinuses/Orbits: Negative orbits. Paranasal negative visible scalp
and face. Sinuses and mastoids are stable and well aerated.

Other: Visible internal auditory structures appear normal.
IMPRESSION: 1. Punctate acute lacunar infarct in the central left thalamus. No
associated hemorrhage or mass effect.
2. Underlying small chronic lacunar infarcts in the bilateral
caudate nucleus, possibly also the right lentiform.

## 2021-01-13 IMAGING — CT CT ANGIO HEAD-NECK (W OR W/O PERF)
2 of 7 series · 8 of 33 positions shown · IV contrast (APPLIED)
Comparison: Head CT and MRI of the brain [DATE]

CLINICAL DATA: Neuro deficit, acute, stroke suspected.

EXAM:
CT ANGIOGRAPHY HEAD AND NECK
TECHNIQUE: Multidetector CT imaging of the head and neck was performed using
the standard protocol during bolus administration of intravenous
contrast. Multiplanar CT image reconstructions and MIPs were
obtained to evaluate the vascular anatomy. Carotid stenosis
measurements (when applicable) are obtained utilizing NASCET
criteria, using the distal internal carotid diameter as the
denominator.
CONTRAST:  75mL OMNIPAQUE IOHEXOL 350 MG/ML SOLN

[Series 4: cta head neck · axial · 0.43mm/px · z∈[+149,+261]mm · 2 of 168 slices shown]
[im 56/168  soft-tissue]
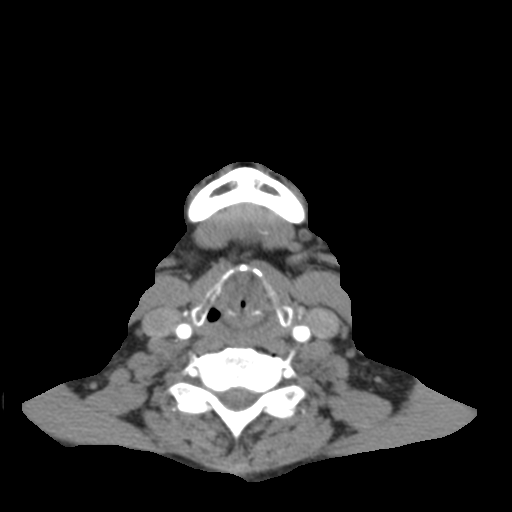
[im 112/168  soft-tissue]
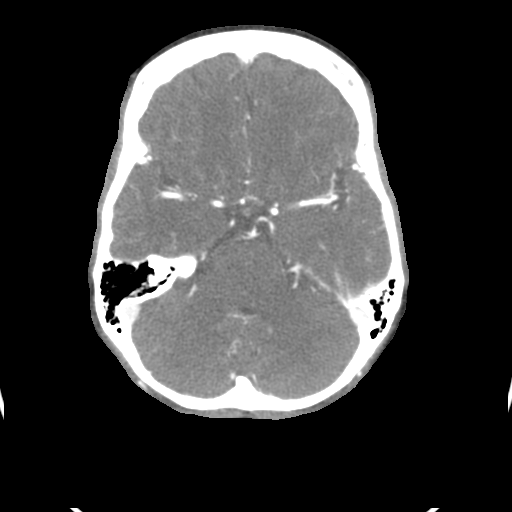

[Series 6: ax thin · axial · 0.40mm/px · z∈[+86,+324]mm · 6 of 334 slices shown]
[im 48/334  soft-tissue]
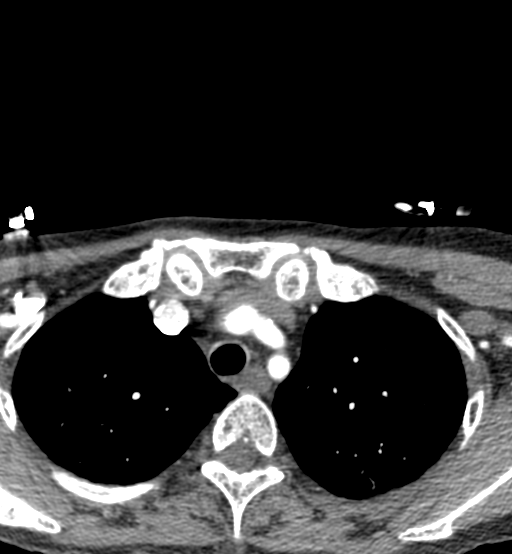
[im 96/334  bone]
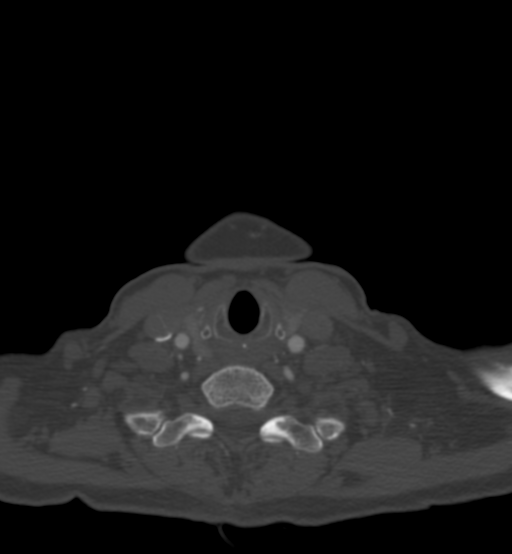
[im 143/334  soft-tissue]
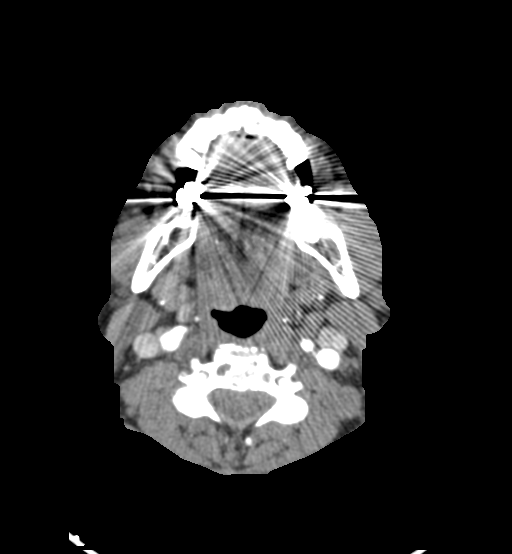
[im 191/334  bone]
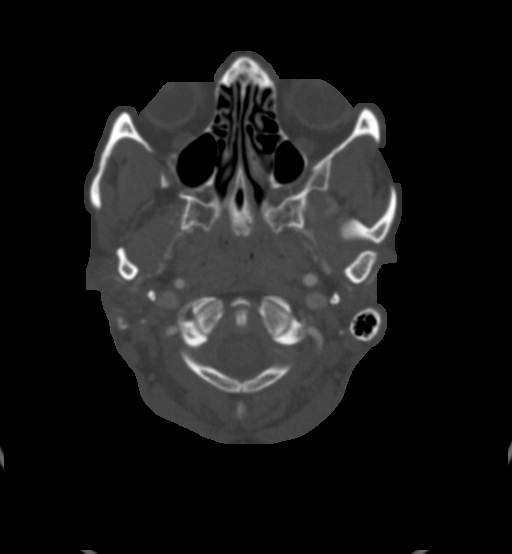
[im 238/334  soft-tissue]
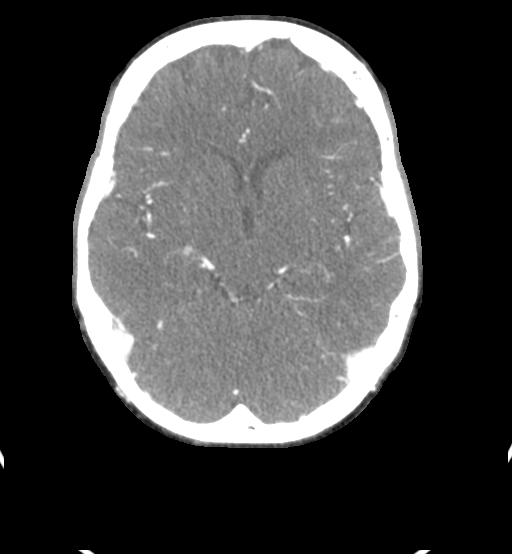
[im 286/334  bone]
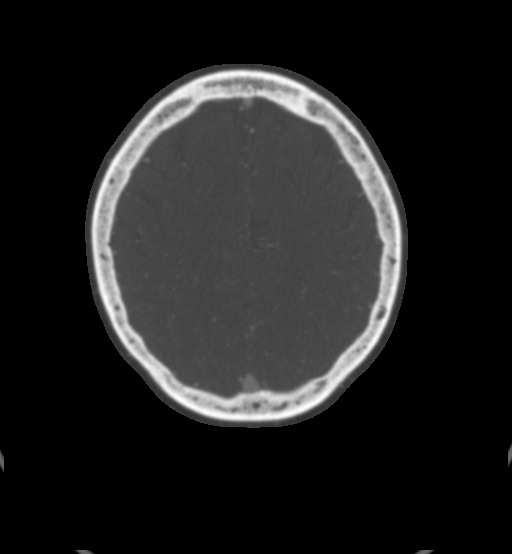

[8 of 33 positions shown; findings below may reference images not displayed]

FINDINGS: CTA NECK FINDINGS

Aortic arch: Common origin of the innominate and left common carotid
artery from the aortic arch. Imaged portion shows no evidence of
aneurysm or dissection. Calcified plaques are noted in the aortic
arch without significant stenosis of the major arch vessel origins.

Right carotid system: Mild atherosclerotic changes of the right
carotid bifurcation without hemodynamically significant stenosis.

Left carotid system: Mild atherosclerotic changes of the left
carotid bifurcation without hemodynamically significant stenosis.

Vertebral arteries: Calcified atherosclerotic plaque at the origin
of the left vertebral artery without hemodynamically significant
stenosis. The vertebral arteries otherwise have normal caliber.

Skeleton: No acute findings.

Other neck: Enlarged multinodular thyroid gland. Ultrasound
evaluation recommended.

Upper chest: No acute findings

Review of the MIP images confirms the above findings

CTA HEAD FINDINGS

Anterior circulation: Calcified plaques in the bilateral carotid
siphons. No significant stenosis or proximal occlusion. A 1-2 mm
outpouching from the undersurface of the supraclinoid left ICA most
likely represents an infundibulum at the origin of the left
posterior communicating artery or, less likely, a small aneurysm.

Posterior circulation: No significant stenosis, proximal occlusion,
aneurysm, or vascular malformation.

Venous sinuses: As permitted by contrast timing, patent.

Anatomic variants: Severely hypoplastic/aplastic right A1/ACA
segment with arterial inflow to the bilateral A2 segments from a
prominent left A1/ACA segment. Hypoplastic right P1/PCA segment with
arterial inflow predominantly from a prominent right posterior
communicating artery.

Review of the MIP images confirms the above findings
IMPRESSION: 1. Minimal atherosclerotic changes in the bilateral carotid
bifurcations without hemodynamically significant stenosis.
2. Mild atherosclerotic changes in the bilateral carotid siphons
without hemodynamically significant stenosis.
3. A 1-2 mm outpouching from the undersurface of the supraclinoid
left ICA most likely represents an infundibulum at the origin of the
left posterior communicating artery or, less likely, a small
aneurysm.
4. Multiple thyroid nodules.  Ultrasound evaluation recommended.

## 2021-01-13 IMAGING — MR MR CERVICAL SPINE W/O CM
5 series · 37 of 48 positions shown · non-contrast
Comparison: Brain MRI today.

CLINICAL DATA: 55-year-old female with right face and hand numbness
and tingling for 1 day. TIA. No known injury.

EXAM:
MRI CERVICAL SPINE WITHOUT CONTRAST
TECHNIQUE: Multiplanar, multisequence MR imaging of the cervical spine was
performed. No intravenous contrast was administered.

[Series 9: T2 · sagittal · 3.0mm · 0.62mm/px · 6 of 17 slices shown (1 of 2)]
[im 1/17]
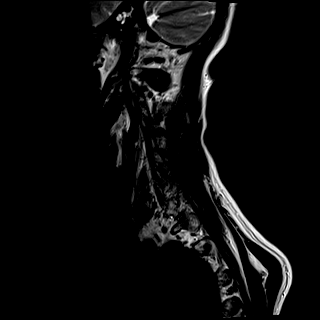
[im 4/17]
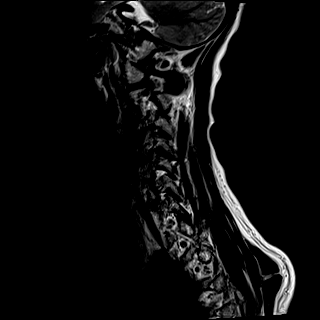
[im 7/17]
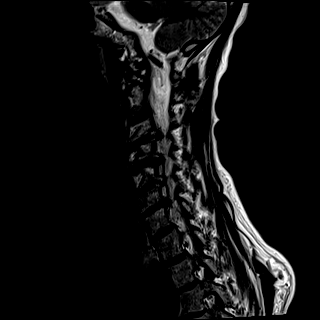
[im 10/17]
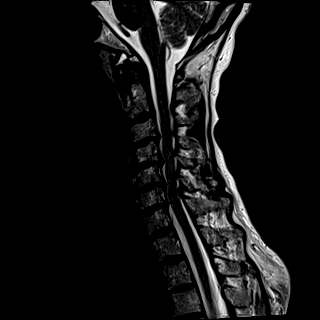
[im 13/17]
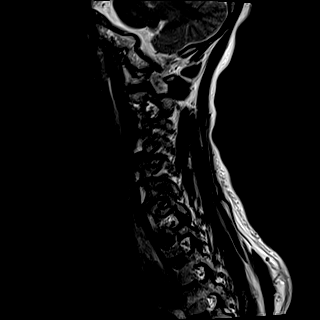
[im 17/17]
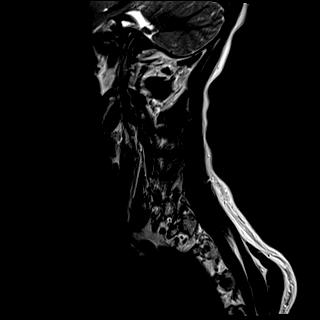

[Series 11: STIR · sagittal · 3.0mm · 0.62mm/px · 7 of 17 slices shown]
[im 1/17]
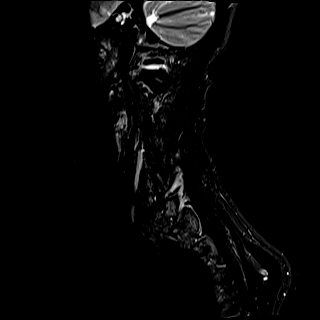
[im 3/17]
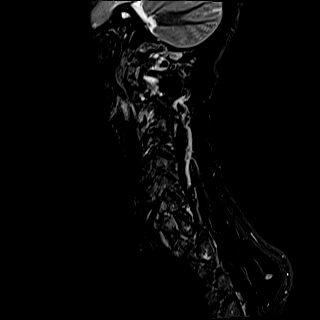
[im 6/17]
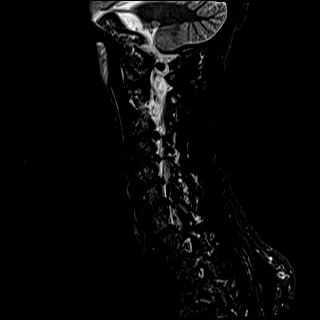
[im 9/17]
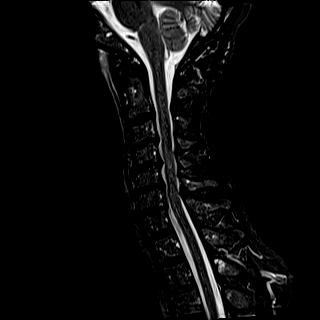
[im 11/17]
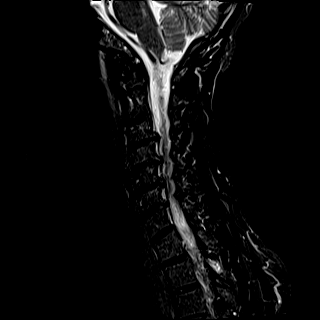
[im 14/17]
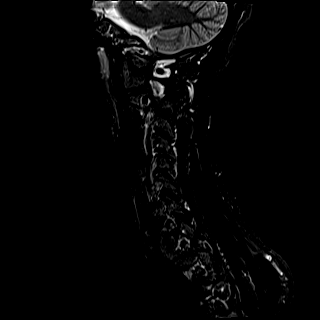
[im 17/17]
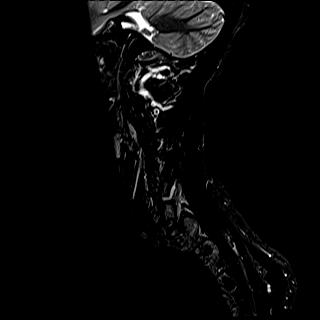

[Series 12: FLAIR · sagittal · 3.0mm · 0.78mm/px · 7 of 17 slices shown]
[im 1/17]
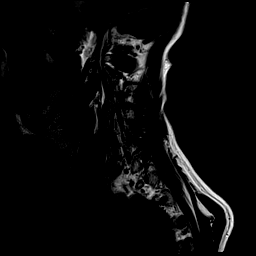
[im 3/17]
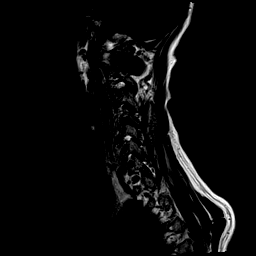
[im 6/17]
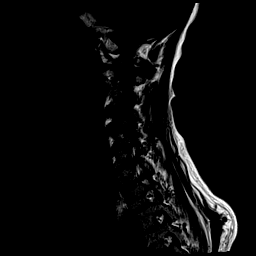
[im 9/17]
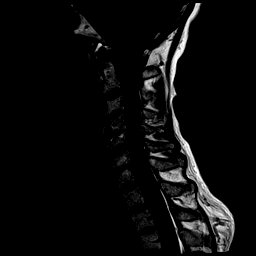
[im 11/17]
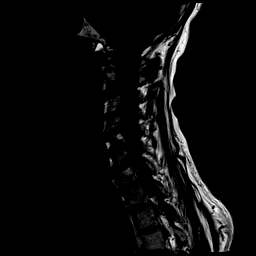
[im 14/17]
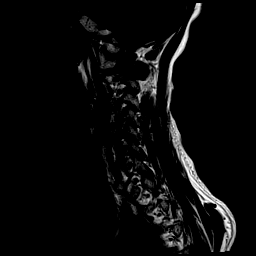
[im 17/17]
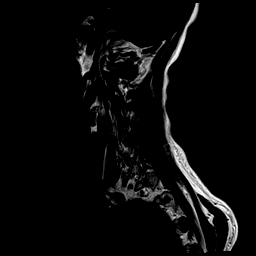

[Series 13: T2 · axial · 3.0mm · 0.70mm/px · z∈[-245,-122]mm · 9 of 36 slices shown (2 of 2)]
[im 1/36]
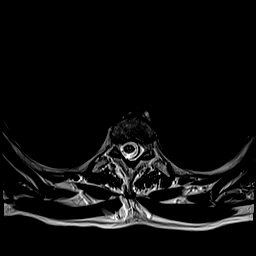
[im 3/36]
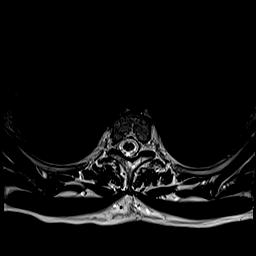
[im 6/36]
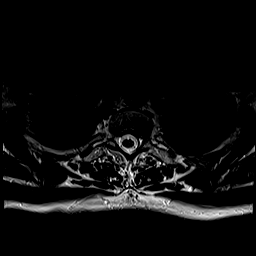
[im 11/36]
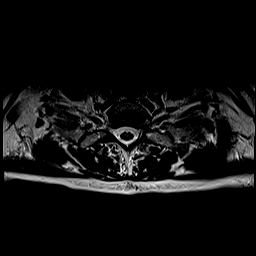
[im 17/36]
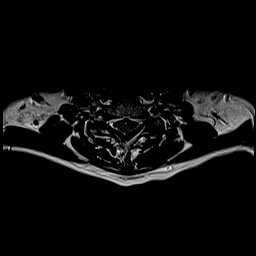
[im 19/36]
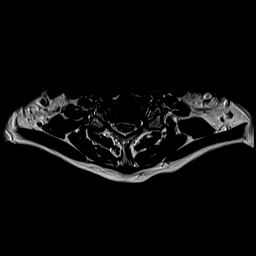
[im 25/36]
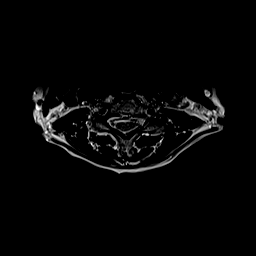
[im 30/36]
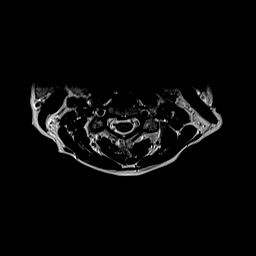
[im 36/36]
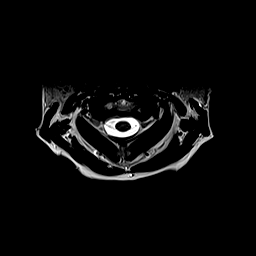

[Series 14: ax mpgr · axial · 3.0mm · 0.35mm/px · z∈[-245,-122]mm · 8 of 36 slices shown]
[im 1/36]
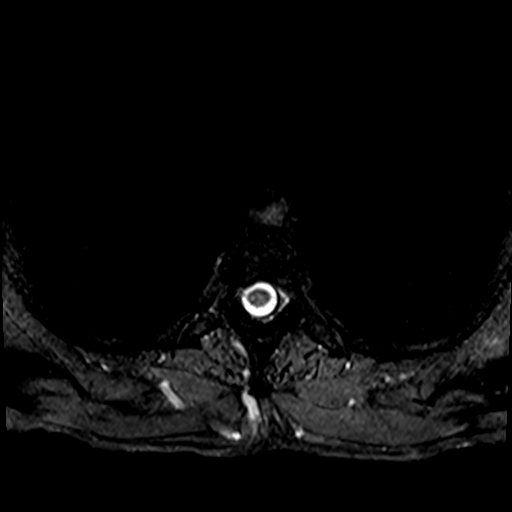
[im 6/36]
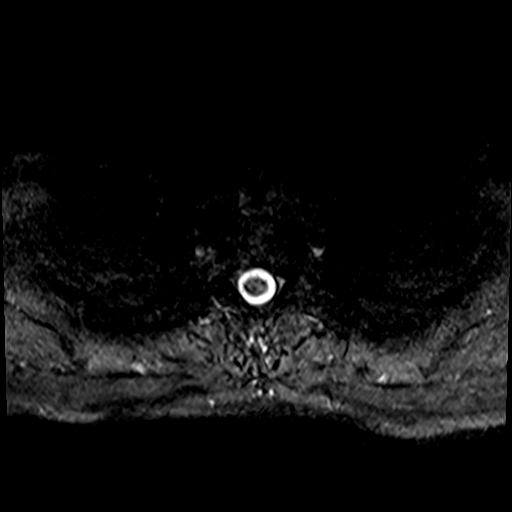
[im 11/36]
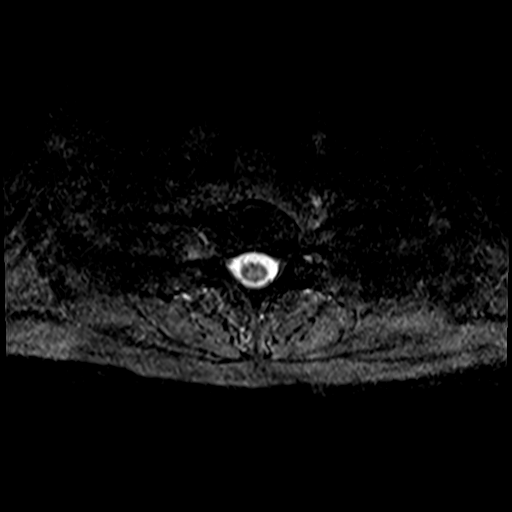
[im 17/36]
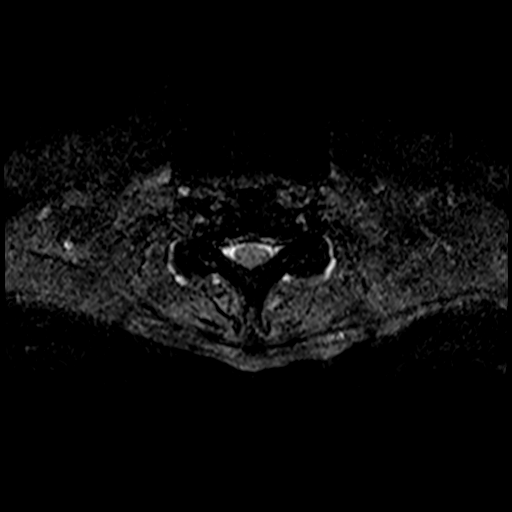
[im 19/36]
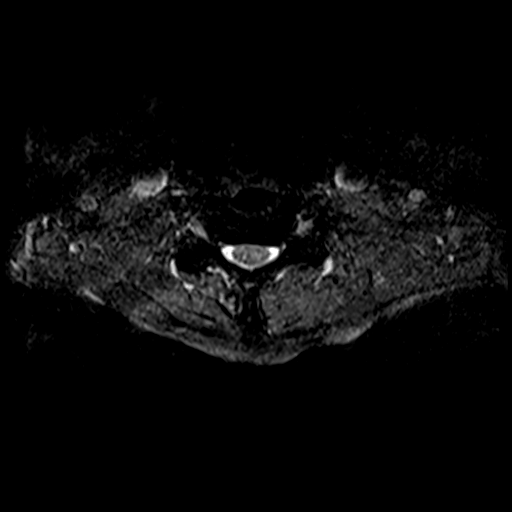
[im 25/36]
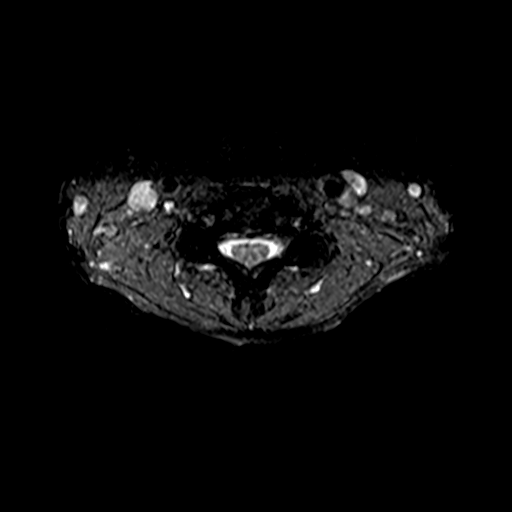
[im 30/36]
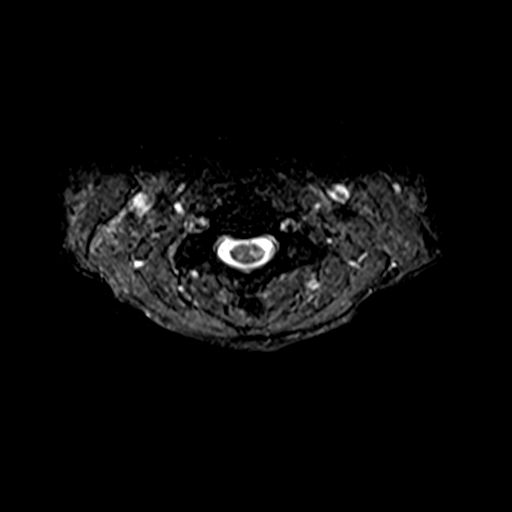
[im 36/36]
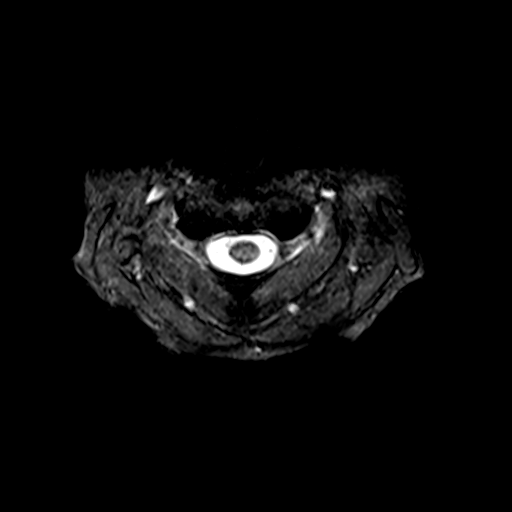

[37 of 48 positions shown; findings below may reference images not displayed]

FINDINGS: Alignment: Straightening of cervical lordosis. No spondylolisthesis.

Vertebrae: Widespread degenerative endplate marrow changes in the
cervical spine. Normal background bone marrow signal. Benign
hemangioma suspected in the left T2 lamina and spinous process, with
a stippled T2 and STIR appearance despite decreased T1 signal. No
marrow edema or evidence of acute osseous abnormality in the
cervical spine.

Cord: No cervical spinal cord signal abnormality despite multilevel
degenerative mass effect. Capacious visible upper thoracic spinal
canal and negative visible upper thoracic cord.

Posterior Fossa, vertebral arteries, paraspinal tissues:
Cervicomedullary junction is within normal limits. Negative visible
posterior fossa. Preserved major vascular flow voids in the neck.
Codominant vertebrals. Negative visible neck soft tissues and lung
apices.

Disc levels:

C2-C3: Minimal disc bulging. Mild ligament flavum hypertrophy. No
stenosis.

C3-C4: Disc space loss with circumferential mildly lobulated disc
osteophyte complex. Mild facet hypertrophy greater on the right.
Mild spinal stenosis. No cord mass effect. Mild to moderate right C4
foraminal stenosis.

C4-C5: Disc space loss with circumferential disc osteophyte complex.
Mild facet and ligament flavum hypertrophy. Mild spinal stenosis and
spinal cord mass effect. Moderate bilateral C5 foraminal stenosis.

C5-C6: Circumferential disc osteophyte complex. Mild spinal
stenosis. Mild if any cord mass effect. Moderate bilateral C6
foraminal stenosis.

C6-C7: Disc space loss and circumferential disc osteophyte complex.
Moderate ligament flavum hypertrophy. Mild facet hypertrophy. No
convincing cord mass effect. Moderate to severe bilateral C7
foraminal stenosis.

C7-T1:  Mild facet hypertrophy.  No stenosis.

No upper thoracic stenosis.
IMPRESSION: 1. Widespread cervical disc and endplate degeneration. Mild spinal
stenosis C3-C4 through C5-C6 with up to mild spinal cord mass
effect, but no cord signal abnormality.
2. Widespread moderate degenerative cervical neural foraminal
stenosis, up to severe at the bilateral C7 nerve levels.

## 2021-01-13 MED ORDER — ENOXAPARIN SODIUM 40 MG/0.4ML IJ SOSY
40.0000 mg | PREFILLED_SYRINGE | INTRAMUSCULAR | Status: DC
  Administered 2021-01-14: 40 mg via SUBCUTANEOUS
  Filled 2021-01-13: qty 0.4

## 2021-01-13 MED ORDER — ATORVASTATIN CALCIUM 20 MG PO TABS
40.0000 mg | ORAL_TABLET | Freq: Every day | ORAL | Status: DC
  Administered 2021-01-13: 40 mg via ORAL
  Filled 2021-01-13: qty 2

## 2021-01-13 MED ORDER — ACETAMINOPHEN 160 MG/5ML PO SOLN
650.0000 mg | ORAL | Status: DC | PRN
  Filled 2021-01-13: qty 20.3

## 2021-01-13 MED ORDER — ASPIRIN 300 MG RE SUPP: 300.0000 mg | Freq: Every day | RECTAL | Status: DC

## 2021-01-13 MED ORDER — ACETAMINOPHEN 325 MG PO TABS: 650.0000 mg | ORAL_TABLET | ORAL | Status: DC | PRN

## 2021-01-13 MED ORDER — NICOTINE 21 MG/24HR TD PT24
21.0000 mg | MEDICATED_PATCH | Freq: Every day | TRANSDERMAL | Status: DC
  Administered 2021-01-13 – 2021-01-14 (×2): 21 mg via TRANSDERMAL
  Filled 2021-01-13 (×2): qty 1

## 2021-01-13 MED ORDER — ASPIRIN 81 MG PO CHEW
81.0000 mg | CHEWABLE_TABLET | Freq: Every day | ORAL | Status: DC
  Administered 2021-01-13: 81 mg via ORAL
  Filled 2021-01-13: qty 1

## 2021-01-13 MED ORDER — ALBUTEROL SULFATE (2.5 MG/3ML) 0.083% IN NEBU: 3.0000 mL | INHALATION_SOLUTION | RESPIRATORY_TRACT | Status: DC | PRN

## 2021-01-13 MED ORDER — IOHEXOL 350 MG/ML SOLN
75.0000 mL | Freq: Once | INTRAVENOUS | Status: AC | PRN
  Administered 2021-01-13: 75 mL via INTRAVENOUS

## 2021-01-13 MED ORDER — ACETAMINOPHEN 650 MG RE SUPP: 650.0000 mg | RECTAL | Status: DC | PRN

## 2021-01-13 MED ORDER — DM-GUAIFENESIN ER 30-600 MG PO TB12: 1.0000 | ORAL_TABLET | Freq: Two times a day (BID) | ORAL | Status: DC | PRN

## 2021-01-13 MED ORDER — ONDANSETRON HCL 4 MG/2ML IJ SOLN: 4.0000 mg | Freq: Three times a day (TID) | INTRAMUSCULAR | Status: DC | PRN

## 2021-01-13 MED ORDER — ASPIRIN 325 MG PO TABS
325.0000 mg | ORAL_TABLET | Freq: Every day | ORAL | Status: DC
  Administered 2021-01-13 – 2021-01-14 (×2): 325 mg via ORAL
  Filled 2021-01-13 (×2): qty 1

## 2021-01-13 MED ORDER — STROKE: EARLY STAGES OF RECOVERY BOOK: Freq: Once | Status: AC

## 2021-01-13 MED ORDER — HYDRALAZINE HCL 20 MG/ML IJ SOLN: 5.0000 mg | INTRAMUSCULAR | Status: DC | PRN

## 2021-01-13 MED ORDER — SENNOSIDES-DOCUSATE SODIUM 8.6-50 MG PO TABS: 1.0000 | ORAL_TABLET | Freq: Every evening | ORAL | Status: DC | PRN

## 2021-01-13 NOTE — ED Notes (Addendum)
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 She advised she uses crack cocaine daily. And she does not want her children to know. MD made aware.

## 2021-01-13 NOTE — ED Notes (Addendum)
RN to bedside. This RN had Pt complete swallow screen that hadn't been completed prior to my shift. Pt passed swallow screen. Pt asked for coffee and food. Pt given cup of coffee and sandwich tray.

## 2021-01-13 NOTE — H&P (Addendum)
History and Physical    Maureen Cook ZOX:[id number] DOB: 27-Jun-1965 DOA: 01/13/2021  Referring MD/NP/PA:   PCP: Patient, No Pcp Per (Inactive)   Patient coming from:  The patient is coming from home.  At baseline, pt is independent for most of ADL.        Chief Complaint: Right facial and right arm numbness  HPI: Maureen Cook is a 55 y.o. female with medical history significant of COPD, anxiety, tobacco abuse, crack cocaine user, who presents with right facial and right arm numbness.  Patient states that she started having right facial and right arm numbness 3 days ago.  The right arm numbness has been waxing and waning, down to her fingers.  Her right arm numbness has slightly improved, but her left facial numbness has been persistent.  Patient denies weakness in extremities.  No facial droop or slurred speech.  No difficult swallowing.  Patient states her she had one episode of sharp right-sided chest pain below her rib cage, which has resolved.  Currently no active chest pain, cough, shortness of breath.  No fever or chills.  Denies nausea, vomiting, diarrhea or abdominal pain.  No symptoms of UTI. Pt states that she uses crack cocaine.   ED Course: pt was found to have WBC 6.9, INR 0.9, PTT 26, pending COVID PCR, electrolytes renal function okay, temperature normal, blood pressure 98/66, 120/72, heart rate 58, 61, RR 16, oxygen saturation 98% on room air.  CT of head is negative for acute intracranial abnormalities.  Patient is placed on MedSurg bed for obs. Dr. Adriana Simas of neurosurgery and Dr. Selina Cooley of neurology are consulted.  MRI-brain 1. Punctate acute lacunar infarct in the central left thalamus. No associated hemorrhage or mass effect. 2. Underlying small chronic lacunar infarcts in the bilateral caudate nucleus, possibly also the right lentiform.  MRI-C spin: 1. Widespread cervical disc and endplate degeneration. Mild spinal stenosis C3-C4 through C5-C6 with up to mild spinal cord  mass effect, but no cord signal abnormality. 2. Widespread moderate degenerative cervical neural foraminal stenosis, up to severe at the bilateral C7 nerve levels.   Review of Systems:   General: no fevers, chills, no body weight gain, fatigue HEENT: no blurry vision, hearing changes or sore throat Respiratory: no dyspnea, coughing, wheezing CV: no chest pain, no palpitations GI: no nausea, vomiting, abdominal pain, diarrhea, constipation GU: no dysuria, burning on urination, increased urinary frequency, hematuria  Ext: no leg edema Neuro: has right facial and right arm numbness, no vision change or hearing loss Skin: no rash, no skin tear. MSK: No muscle spasm, no deformity, no limitation of range of movement in spin Heme: No easy bruising.  Travel history: No recent long distant travel.  Allergy:  Allergies  Allergen Reactions   Vicodin [Hydrocodone-Acetaminophen] Hives    Past Medical History:  Diagnosis Date   Anxiety    COPD (chronic obstructive pulmonary disease) (HCC)    Tobacco abuse     Past Surgical History:  Procedure Laterality Date   hemoroidectomy  2003/2002   tubiligation      Social History:  reports that she has been smoking cigarettes. She has a 12.50 pack-year smoking history. She has never used smokeless tobacco. She reports current drug use. Drug: "Crack" cocaine. She reports that she does not drink alcohol.  Family History:  Family History  Problem Relation Age of Onset   Diabetes Mother    Hyperlipidemia Mother    Hypertension Mother  Prior to Admission medications   Medication Sig Start Date End Date Taking? Authorizing Provider  amoxicillin (AMOXIL) 500 MG tablet Take 1 tablet (500 mg total) by mouth 3 (three) times daily. 12/12/15   Triplett, Cari B, FNP  diazepam (VALIUM) 2 MG tablet Take 1 tablet (2 mg total) by mouth every 8 (eight) hours as needed for anxiety. 03/17/20   Arnaldo NatalMalinda, Paul F, MD  ibuprofen (ADVIL,MOTRIN) 800 MG tablet  Take breakfast, lunch and dinner with food on stomache 05/11/11   Hunt, Bethany, PA-C  traMADol (ULTRAM) 50 MG tablet Take 1 tablet (50 mg total) by mouth every 6 (six) hours as needed. 12/12/15   Chinita Pesterriplett, Cari B, FNP    Physical Exam: Vitals:   01/13/21 0250 01/13/21 0530 01/13/21 0737 01/13/21 0830  BP: 98/66 (!) 116/101 120/72 (!) 141/68  Pulse: (!) 58 61 61   Resp: 16 16 16    Temp: 98 F (36.7 C)     TempSrc: Axillary     SpO2: 98% 99% 100%   Weight: 53.1 kg     Height: 5\' 2"  (1.575 m)      General: Not in acute distress HEENT:       Eyes: PERRL, EOMI, no scleral icterus.       ENT: No discharge from the ears and nose, no pharynx injection, no tonsillar enlargement.        Neck: No JVD, no bruit, no mass felt. Heme: No neck lymph node enlargement. Cardiac: S1/S2, RRR, No murmurs, No gallops or rubs. Respiratory: No rales, wheezing, rhonchi or rubs. GI: Soft, nondistended, nontender, no rebound pain, no organomegaly, BS present. GU: No hematuria Ext: No pitting leg edema bilaterally. 1+DP/PT pulse bilaterally. Musculoskeletal: No joint deformities, No joint redness or warmth, no limitation of ROM in spin. Skin: No rashes.  Neuro: Alert, oriented X3, cranial nerves II-XII grossly intact, moves all extremities normally. Muscle strength 5/5 in all extremities, sensation to light touch is decreased in right face and the right arm.  Psych: Patient is not psychotic, no suicidal or hemocidal ideation.  Labs on Admission: I have personally reviewed following labs and imaging studies  CBC: Recent Labs  Lab 01/13/21 0258  WBC 6.9  NEUTROABS 3.5  HGB 16.1*  HCT 48.5*  MCV 90.0  PLT 262   Basic Metabolic Panel: Recent Labs  Lab 01/13/21 0258  NA 140  K 4.4  CL 105  CO2 28  GLUCOSE 94  BUN 18  CREATININE 0.82  CALCIUM 9.1   GFR: Estimated Creatinine Clearance: 59.9 mL/min (by C-G formula based on SCr of 0.82 mg/dL). Liver Function Tests: Recent Labs  Lab  01/13/21 0258  AST 13*  ALT 29  ALKPHOS 75  BILITOT 0.6  PROT 5.9*  ALBUMIN 3.4*   No results for input(s): LIPASE, AMYLASE in the last 168 hours. No results for input(s): AMMONIA in the last 168 hours. Coagulation Profile: Recent Labs  Lab 01/13/21 0258  INR 0.9   Cardiac Enzymes: No results for input(s): CKTOTAL, CKMB, CKMBINDEX, TROPONINI in the last 168 hours. BNP (last 3 results) No results for input(s): PROBNP in the last 8760 hours. HbA1C: No results for input(s): HGBA1C in the last 72 hours. CBG: No results for input(s): GLUCAP in the last 168 hours. Lipid Profile: No results for input(s): CHOL, HDL, LDLCALC, TRIG, CHOLHDL, LDLDIRECT in the last 72 hours. Thyroid Function Tests: No results for input(s): TSH, T4TOTAL, FREET4, T3FREE, THYROIDAB in the last 72 hours. Anemia Panel: No results for input(s):  VITAMINB12, FOLATE, FERRITIN, TIBC, IRON, RETICCTPCT in the last 72 hours. Urine analysis:    Component Value Date/Time   COLORURINE Amber 02/13/2014 2354   APPEARANCEUR Cloudy 02/13/2014 2354   LABSPEC 1.020 02/13/2014 2354   PHURINE 5.0 02/13/2014 2354   GLUCOSEU Negative 02/13/2014 2354   HGBUR Negative 02/13/2014 2354   BILIRUBINUR Negative 02/13/2014 2354   KETONESUR 1+ 02/13/2014 2354   PROTEINUR Negative 02/13/2014 2354   NITRITE Negative 02/13/2014 2354   LEUKOCYTESUR Negative 02/13/2014 2354   Sepsis Labs: @LABRCNTIP (procalcitonin:4,lacticidven:4) ) Recent Results (from the past 240 hour(s))  Resp Panel by RT-PCR (Flu A&B, Covid) Nasopharyngeal Swab     Status: None   Collection Time: 01/13/21  7:54 AM   Specimen: Nasopharyngeal Swab; Nasopharyngeal(NP) swabs in vial transport medium  Result Value Ref Range Status   SARS Coronavirus 2 by RT PCR NEGATIVE NEGATIVE Final    Comment: (NOTE) SARS-CoV-2 target nucleic acids are NOT DETECTED.  The SARS-CoV-2 RNA is generally detectable in upper respiratory specimens during the acute phase of  infection. The lowest concentration of SARS-CoV-2 viral copies this assay can detect is 138 copies/mL. A negative result does not preclude SARS-Cov-2 infection and should not be used as the sole basis for treatment or other patient management decisions. A negative result may occur with  improper specimen collection/handling, submission of specimen other than nasopharyngeal swab, presence of viral mutation(s) within the areas targeted by this assay, and inadequate number of viral copies(<138 copies/mL). A negative result must be combined with clinical observations, patient history, and epidemiological information. The expected result is Negative.  Fact Sheet for Patients:  03/15/21  Fact Sheet for Healthcare Providers:  BloggerCourse.com  This test is no t yet approved or cleared by the [employer] FDA and  has been authorized for detection and/or diagnosis of SARS-CoV-2 by FDA under an Emergency Use Authorization (EUA). This EUA will remain  in effect (meaning this test can be used) for the duration of the COVID-19 declaration under Section 564(b)(1) of the Act, 21 U.S.C.section 360bbb-3(b)(1), unless the authorization is terminated  or revoked sooner.       Influenza A by PCR NEGATIVE NEGATIVE Final   Influenza B by PCR NEGATIVE NEGATIVE Final    Comment: (NOTE) The Xpert Xpress SARS-CoV-2/FLU/RSV plus assay is intended as an aid in the diagnosis of influenza from Nasopharyngeal swab specimens and should not be used as a sole basis for treatment. Nasal washings and aspirates are unacceptable for Xpert Xpress SARS-CoV-2/FLU/RSV testing.  Fact Sheet for Patients: [place]  Fact Sheet for Healthcare Providers: BloggerCourse.com  This test is not yet approved or cleared by the [employer] FDA and has been authorized for detection and/or diagnosis of SARS-CoV-2  by FDA under an Emergency Use Authorization (EUA). This EUA will remain in effect (meaning this test can be used) for the duration of the COVID-19 declaration under Section 564(b)(1) of the Act, 21 U.S.C. section 360bbb-3(b)(1), unless the authorization is terminated or revoked.  Performed at [facility], [address] [place], [place]      Radiological Exams on Admission: CT HEAD WO CONTRAST  Result Date: 01/13/2021 CLINICAL DATA:  Right facial numbness and right arm numbness EXAM: CT HEAD WITHOUT CONTRAST TECHNIQUE: Contiguous axial images were obtained from the base of the skull through the vertex without intravenous contrast. COMPARISON:  None. FINDINGS: Brain: No evidence of acute infarction, hemorrhage, hydrocephalus, extra-axial collection or mass lesion/mass effect. Vascular: No hyperdense vessel or unexpected calcification. Skull: Normal.  Negative for fracture or focal lesion. Sinuses/Orbits: The visualized paranasal sinuses are essentially clear. The mastoid air cells are unopacified. Other: None. IMPRESSION: Normal head CT. Electronically Signed   By: Charline Bills M.D.   On: 01/13/2021 03:21   MR BRAIN WO CONTRAST  Result Date: 01/13/2021 CLINICAL DATA:  55 year old female with right face and hand numbness and tingling for 1 day. TIA. No known injury. EXAM: MRI HEAD WITHOUT CONTRAST TECHNIQUE: Multiplanar, multiecho pulse sequences of the brain and surrounding structures were obtained without intravenous contrast. COMPARISON:  Head CT without contrast 0312 hours today. Cervical spine MRI reported separately. FINDINGS: Brain: Punctate, 3 mm focus of restricted diffusion in the central left thalamus (series 10, image 74). Mild associated T2 and FLAIR hyperintensity. No hemorrhage or mass effect. No other restricted diffusion. Small chronic lacunar infarcts in the bilateral caudate nuclei, and small perivascular spaces versus chronic lacune in the right  lentiform. Mild superimposed additional scattered nonspecific cerebral white matter T2 and FLAIR hyperintensity. Brainstem and cerebellum appear negative. No chronic cerebral blood products identified. No midline shift, mass effect, evidence of mass lesion, ventriculomegaly, extra-axial collection or acute intracranial hemorrhage. Cervicomedullary junction and pituitary are within normal limits. Vascular: Major intracranial vascular flow voids are preserved. Skull and upper cervical spine: Cervical spine detailed separately. Visualized bone marrow signal is within normal limits. Sinuses/Orbits: Negative orbits. Paranasal negative visible scalp and face. Sinuses and mastoids are stable and well aerated. Other: Visible internal auditory structures appear normal. IMPRESSION: 1. Punctate acute lacunar infarct in the central left thalamus. No associated hemorrhage or mass effect. 2. Underlying small chronic lacunar infarcts in the bilateral caudate nucleus, possibly also the right lentiform. Electronically Signed   By: Odessa Fleming M.D.   On: 01/13/2021 07:32   MR Cervical Spine Wo Contrast  Result Date: 01/13/2021 CLINICAL DATA:  55 year old female with right face and hand numbness and tingling for 1 day. TIA. No known injury. EXAM: MRI CERVICAL SPINE WITHOUT CONTRAST TECHNIQUE: Multiplanar, multisequence MR imaging of the cervical spine was performed. No intravenous contrast was administered. COMPARISON:  Brain MRI today. FINDINGS: Alignment: Straightening of cervical lordosis. No spondylolisthesis. Vertebrae: Widespread degenerative endplate marrow changes in the cervical spine. Normal background bone marrow signal. Benign hemangioma suspected in the left T2 lamina and spinous process, with a stippled T2 and STIR appearance despite decreased T1 signal. No marrow edema or evidence of acute osseous abnormality in the cervical spine. Cord: No cervical spinal cord signal abnormality despite multilevel degenerative mass  effect. Capacious visible upper thoracic spinal canal and negative visible upper thoracic cord. Posterior Fossa, vertebral arteries, paraspinal tissues: Cervicomedullary junction is within normal limits. Negative visible posterior fossa. Preserved major vascular flow voids in the neck. Codominant vertebrals. Negative visible neck soft tissues and lung apices. Disc levels: C2-C3: Minimal disc bulging. Mild ligament flavum hypertrophy. No stenosis. C3-C4: Disc space loss with circumferential mildly lobulated disc osteophyte complex. Mild facet hypertrophy greater on the right. Mild spinal stenosis. No cord mass effect. Mild to moderate right C4 foraminal stenosis. C4-C5: Disc space loss with circumferential disc osteophyte complex. Mild facet and ligament flavum hypertrophy. Mild spinal stenosis and spinal cord mass effect. Moderate bilateral C5 foraminal stenosis. C5-C6: Circumferential disc osteophyte complex. Mild spinal stenosis. Mild if any cord mass effect. Moderate bilateral C6 foraminal stenosis. C6-C7: Disc space loss and circumferential disc osteophyte complex. Moderate ligament flavum hypertrophy. Mild facet hypertrophy. No convincing cord mass effect. Moderate to severe bilateral C7 foraminal stenosis. C7-T1:  Mild facet  hypertrophy.  No stenosis. No upper thoracic stenosis. IMPRESSION: 1. Widespread cervical disc and endplate degeneration. Mild spinal stenosis C3-C4 through C5-C6 with up to mild spinal cord mass effect, but no cord signal abnormality. 2. Widespread moderate degenerative cervical neural foraminal stenosis, up to severe at the bilateral C7 nerve levels. Electronically Signed   By: Odessa Fleming M.D.   On: 01/13/2021 07:37     EKG: I have personally reviewed.  Sinus rhythm, QTC 416, anteroseptal infarction pattern,  Assessment/Plan Principal Problem:   Stroke [facility]) Active Problems:   Tobacco abuse   Anxiety   COPD (chronic obstructive pulmonary disease) (HCC)   Cervical spinal  stenosis   Cocaine abuse (HCC)   Stroke [facility]): MRI showed punctate acute lacunar infarct in the central left thalamus. No associated hemorrhage or mass effect. Dr. Selina Cooley of neuro is consulted.  -Placed on MedSurg bed for observation - ASA - start lipitor 40 mg daily - fasting lipid panel and HbA1c  - 2D transthoracic echocardiography  - swallowing screen. If fails, will get SLP - Check UDS  - PT/OT consult  Tobacco abuse and cocaine abuse -Did counseling about importance of quitting substance -Nicotine patch  Anxiety:  pt is currently not taking medications. -Observe closely  COPD (chronic obstructive pulmonary disease) (HCC): Stable.  No cough or shortness breath -prn albuterol and mucinex  Cervical spinal stenosis: MRI of C spine showed widespread cervical disc and endplate degeneration with mild spinal cord mass effect, but no cord signal abnormality. Dr. Adriana Simas of neurosurgery was consulted. Per Dr. Adriana Simas, stenosis is very mild, no surgery indicated at this time.  They can see patient in clinic. -f/u with neurosurgery as outpatient    DVT ppx: SQ Lovenox Code Status: Full code Family Communication: not done, no family member is at bed side.      Disposition Plan:  Anticipate discharge back to previous environment Consults called:  Dr. Adriana Simas of neurosurgery and Dr. Selina Cooley of neurology are consulted. Admission status and Level of care: Med-Surg:  for obs      Date of Service 01/13/2021    Lorretta Harp [facility]   If 7PM-7AM, please contact night-coverage [email or web address] 01/13/2021, 9:31 AM

## 2021-01-13 NOTE — ED Notes (Signed)
Pt advised son Evianna Chandran [phone] and daughter Manus Rudd, both have pt permission to obtain information and updates on mother.

## 2021-01-13 NOTE — ED Provider Notes (Signed)
[facility] Emergency Department Provider Note   ____________________________________________   Event Date/Time   First MD Initiated Contact with Patient 01/13/21 [phone]     (approximate)  I have reviewed the triage vital signs and the nursing notes.   HISTORY  Chief Complaint facial numbness    HPI Maureen Cook is a 55 y.o. female who presents to the ED from home with a chief complaint of numbness.  Patient awoke 2 days ago with constant right-sided facial numbness.  Endorses waxing/waning right arm numbness down to her fingertips.  Denies facial droop, altered mentation, extremity weakness.  Denies headache, vision changes, neck pain, fever, cough, chest pain, shortness of breath, abdominal pain, nausea, vomiting or dizziness.  Denies recent injury, fall or trauma.    Past Medical History:  Diagnosis Date   COPD (chronic obstructive pulmonary disease) (HCC)     There are no problems to display for this patient.   Past Surgical History:  Procedure Laterality Date   hemoroidectomy  2003/2002   tubiligation      Prior to Admission medications   Medication Sig Start Date End Date Taking? Authorizing Provider  amoxicillin (AMOXIL) 500 MG tablet Take 1 tablet (500 mg total) by mouth 3 (three) times daily. 12/12/15   Triplett, Cari B, FNP  diazepam (VALIUM) 2 MG tablet Take 1 tablet (2 mg total) by mouth every 8 (eight) hours as needed for anxiety. 03/17/20   Arnaldo Natal, MD  ibuprofen (ADVIL,MOTRIN) 800 MG tablet Take breakfast, lunch and dinner with food on stomache 05/11/11   Hunt, Bethany, PA-C  traMADol (ULTRAM) 50 MG tablet Take 1 tablet (50 mg total) by mouth every 6 (six) hours as needed. 12/12/15   Chinita Pester, FNP    Allergies Vicodin [hydrocodone-acetaminophen]  Family History  Problem Relation Age of Onset   Diabetes Mother    Hyperlipidemia Mother    Hypertension Mother     Social History Social History   Tobacco Use    Smoking status: Every Day    Packs/day: 0.50    Years: 25.00    Pack years: 12.50    Types: Cigarettes   Smokeless tobacco: Never  Substance Use Topics   Alcohol use: No   Drug use: No    Review of Systems  Constitutional: No fever/chills Eyes: No visual changes. ENT: No sore throat. Cardiovascular: Denies chest pain. Respiratory: Denies shortness of breath. Gastrointestinal: No abdominal pain.  No nausea, no vomiting.  No diarrhea.  No constipation. Genitourinary: Negative for dysuria. Musculoskeletal: Negative for back pain. Skin: Negative for rash. Neurological: Negative for headaches, focal weakness.  Positive for numbness.   ____________________________________________   PHYSICAL EXAM:  VITAL SIGNS: ED Triage Vitals [01/13/21 0250]  Enc Vitals Group     BP 98/66     Pulse Rate (!) 58     Resp 16     Temp 98 F (36.7 C)     Temp Source Axillary     SpO2 98 %     Weight 117 lb (53.1 kg)     Height 5\' 2"  (1.575 m)     Head Circumference      Peak Flow      Pain Score 0     Pain Loc      Pain Edu?      Excl. in GC?     Constitutional: Alert and oriented. Well appearing and in no acute distress. Eyes: Conjunctivae are normal. PERRL. EOMI. Head: Atraumatic. Nose:  No congestion/rhinnorhea. Mouth/Throat: Mucous membranes are moist.   Neck: No stridor.  Supple neck without meningismus.  No carotid bruits. Cardiovascular: Normal rate, regular rhythm. Grossly normal heart sounds.  Good peripheral circulation. Respiratory: Normal respiratory effort.  No retractions. Lungs CTAB. Gastrointestinal: Soft and nontender. No distention. No abdominal bruits. No CVA tenderness. Musculoskeletal: No lower extremity tenderness nor edema.  No joint effusions. Neurologic: Alert and oriented x3.  CN II to XII grossly intact.  Normal speech and language. No gross focal neurologic deficits are appreciated. No gait instability. Skin:  Skin is warm, dry and intact. No rash  noted. Psychiatric: Mood and affect are normal. Speech and behavior are normal.  ____________________________________________   LABS (all labs ordered are listed, but only abnormal results are displayed)  Labs Reviewed  CBC - Abnormal; Notable for the following components:      Result Value   RBC 5.39 (*)    Hemoglobin 16.1 (*)    HCT 48.5 (*)    All other components within normal limits  COMPREHENSIVE METABOLIC PANEL - Abnormal; Notable for the following components:   Total Protein 5.9 (*)    Albumin 3.4 (*)    AST 13 (*)    All other components within normal limits  PROTIME-INR  APTT  DIFFERENTIAL   ____________________________________________  EKG  ED ECG REPORT I, Adellyn Capek J, the attending physician, personally viewed and interpreted this ECG.   Date: 01/13/2021  EKG Time: 0303  Rate: 57  Rhythm: sinus bradycardia  Axis: Normal  Intervals:none  ST&T Change: Nonspecific  ____________________________________________  RADIOLOGY I, Spiros Greenfeld J, personally viewed and evaluated these images (plain radiographs) as part of my medical decision making, as well as reviewing the written report by the radiologist.  ED MD interpretation: CT demonstrates no ICH   Official radiology report(s): CT HEAD WO CONTRAST  Result Date: 01/13/2021 CLINICAL DATA:  Right facial numbness and right arm numbness EXAM: CT HEAD WITHOUT CONTRAST TECHNIQUE: Contiguous axial images were obtained from the base of the skull through the vertex without intravenous contrast. COMPARISON:  None. FINDINGS: Brain: No evidence of acute infarction, hemorrhage, hydrocephalus, extra-axial collection or mass lesion/mass effect. Vascular: No hyperdense vessel or unexpected calcification. Skull: Normal. Negative for fracture or focal lesion. Sinuses/Orbits: The visualized paranasal sinuses are essentially clear. The mastoid air cells are unopacified. Other: None. IMPRESSION: Normal head CT. Electronically Signed    By: Charline Bills M.D.   On: 01/13/2021 03:21    ____________________________________________   PROCEDURES  Procedure(s) performed (including Critical Care):  Procedures  NIH Stroke Scale  Interval: Baseline Time: 6:51 AM Person Administering Scale: Zakiyah Diop J  Administer stroke scale items in the order listed. Record performance in each category after each subscale exam. Do not go back and change scores. Follow directions provided for each exam technique. Scores should reflect what the patient does, not what the clinician thinks the patient can do. The clinician should record answers while administering the exam and work quickly. Except where indicated, the patient should not be coached (i.e., repeated requests to patient to make a special effort).   1a  Level of consciousness: 0=alert; keenly responsive  1b. LOC questions:  0=Performs both tasks correctly  1c. LOC commands: 0=Performs both tasks correctly  2.  Best Gaze: 0=normal  3.  Visual: 0=No visual loss  4. Facial Palsy: 0=Normal symmetric movement  5a.  Motor left arm: 0=No drift, limb holds 90 (or 45) degrees for full 10 seconds  5b.  Motor right arm:  0=No drift, limb holds 90 (or 45) degrees for full 10 seconds  6a. motor left leg: 0=No drift, limb holds 90 (or 45) degrees for full 10 seconds  6b  Motor right leg:  0=No drift, limb holds 90 (or 45) degrees for full 10 seconds  7. Limb Ataxia: 0=Absent  8.  Sensory: 1=Mild to moderate sensory loss; patient feels pinprick is less sharp or is dull on the affected side; there is a loss of superficial pain with pinprick but patient is aware She is being touched  9. Best Language:  0=No aphasia, normal  10. Dysarthria: 0=Normal  11. Extinction and Inattention: 0=No abnormality  12. Distal motor function: 0=Normal   Total:   1    ____________________________________________   INITIAL IMPRESSION / ASSESSMENT AND PLAN / ED COURSE  As part of my medical decision  making, I reviewed the following data within the electronic MEDICAL RECORD NUMBER Nursing notes reviewed and incorporated, Labs reviewed, EKG interpreted, Old chart reviewed, Radiograph reviewed, and Notes from prior ED visits     55 year old female presenting with right facial and upper extremity numbness.  Differential diagnosis includes but is not limited to TIA, CVA, ACS, radiculopathy, metabolic, infectious etiologies, etc.  Laboratory results unremarkable, CT head negative for ICH.  NIH stroke scale of 1; out of tPA window as symptoms have been ongoing for greater than 48 hours.  We will proceed with MRI brain and cervical spine.  Will reassess.  Clinical Course as of 01/13/21 0651  Mon Jan 13, 2021  3704 Patient currently in MRI.  Family member at bedside.  Care will be transferred to the oncoming provider pending results of MRI. [JS]    Clinical Course User Index [JS] Irean Hong, MD     ____________________________________________   FINAL CLINICAL IMPRESSION(S) / ED DIAGNOSES  Final diagnoses:  Paresthesia     ED Discharge Orders     None        Note:  This document was prepared using Dragon voice recognition software and may include unintentional dictation errors.    Irean Hong, MD 01/13/21 [phone]

## 2021-01-13 NOTE — ED Triage Notes (Addendum)
Pt states woke up two days ago with right sided facial numbness and right arm numbness. Pt with equal grips, symmetrical face, and clear speech. Pt denies headache, nausea, vomiting. Pt states right arm numbness did improve some, but her face has consistently had numbness.

## 2021-01-13 NOTE — Consult Note (Signed)
NEUROLOGY CONSULTATION NOTE   Date of service: January 13, 2021 Patient Name: Maureen Cook MRN:  [id number] DOB:  1965-09-19 Reason for consult: L thalamic storke Requesting physician: Lorretta Harp MD _ _ _   _ __   _ __ _ _  __ __   _ __   __ _  History of Present Illness   This is a 55 year old woman with a history significant for COPD, tobacco abuse, use of crack cocaine, anxiety who presents with numbness of her right face and right arm for 3 days.  The numbness in both her right arm and face are slightly improved but still persist.  She denies weakness in her face or any extremities.  She has no facial droop, no slurred speech, no word finding difficulties, no difficulty swallowing.  No signs or symptoms of infection.  MRI of the C-spine showed stenosis at C3-C6 with mild mass-effect.  Neurosurgery was consulted but does not the patient needs acute intervention. She was on ASA 81mg  daily prior to admission.  Workup this admission:  MRI brain wo contrast 1. Punctate acute lacunar infarct in the central left thalamus. No associated hemorrhage or mass effect. 2. Underlying small chronic lacunar infarcts in the bilateral caudate nucleus, possibly also the right lentiform.  CNS imaging personally reviewed  MRI c spine wo 1. Widespread cervical disc and endplate degeneration. Mild spinal stenosis C3-C4 through C5-C6 with up to mild spinal cord mass effect, but no cord signal abnormality. 2. Widespread moderate degenerative cervical neural foraminal stenosis, up to severe at the bilateral C7 nerve levels.  Pending: CTA H&N TTE A1c LDL   ROS   Per HPI; all other systems reviewed and are negative  Past History   Past Medical History:  Diagnosis Date   Anxiety    COPD (chronic obstructive pulmonary disease) (HCC)    Tobacco abuse    Past Surgical History:  Procedure Laterality Date   hemoroidectomy  2003/2002   tubiligation     Family History  Problem Relation Age of  Onset   Diabetes Mother    Hyperlipidemia Mother    Hypertension Mother    Social History   Socioeconomic History   Marital status: Single    Spouse name: Not on file   Number of children: Not on file   Years of education: Not on file   Highest education level: Not on file  Occupational History   Not on file  Tobacco Use   Smoking status: Every Day    Packs/day: 0.50    Years: 25.00    Pack years: 12.50    Types: Cigarettes   Smokeless tobacco: Never  Substance and Sexual Activity   Alcohol use: No   Drug use: Yes    Types: "Crack" cocaine   Sexual activity: Yes  Other Topics Concern   Not on file  Social History Narrative   Not on file   Social Determinants of Health   Financial Resource Strain: Not on file  Food Insecurity: Not on file  Transportation Needs: Not on file  Physical Activity: Not on file  Stress: Not on file  Social Connections: Not on file   Allergies  Allergen Reactions   Vicodin [Hydrocodone-Acetaminophen] Hives    Medications   Medications Prior to Admission  Medication Sig Dispense Refill Last Dose   aspirin 81 MG chewable tablet Chew 81 mg by mouth daily.   01/12/2021   amoxicillin (AMOXIL) 500 MG tablet Take 1 tablet (500 mg total) by  mouth 3 (three) times daily. (Patient not taking: No sig reported) 30 tablet 0 Completed Course   diazepam (VALIUM) 2 MG tablet Take 1 tablet (2 mg total) by mouth every 8 (eight) hours as needed for anxiety. (Patient not taking: No sig reported) 9 tablet 0 Completed Course   ibuprofen (ADVIL,MOTRIN) 800 MG tablet Take breakfast, lunch and dinner with food on stomache (Patient not taking: No sig reported) 21 tablet 0 Not Taking   traMADol (ULTRAM) 50 MG tablet Take 1 tablet (50 mg total) by mouth every 6 (six) hours as needed. (Patient not taking: No sig reported) 12 tablet 0 Not Taking     Vitals   Vitals:   01/13/21 0830 01/13/21 1306 01/13/21 1400 01/13/21 1500  BP: (!) 141/68 134/65 (!) 142/78 (!)  125/59  Pulse:  60 70 (!) 56  Resp:  20 18 16   Temp:   98.4 F (36.9 C) 97.9 F (36.6 C)  TempSrc:   Oral Oral  SpO2:  98% 98% 100%  Weight:      Height:         Body mass index is 21.4 kg/m.  Physical Exam   Physical Exam Gen: A&O x4, NAD Resp: CTAB CV: RRR Extrem: Nml bulk; no cyanosis, clubbing, or edema.  Neuro: *MS: A&O x4. Follows multi-step commands.  *Speech: fluid, nondysarthric, able to name and repeat *CN:    I: Deferred   II,III: PERRLA, VFF by confrontation, optic discs not visualized 2/2 pupillary construction   III,IV,VI: EOMI w/o nystagmus, no ptosis   V: Numbness V2 on R   VII: Eyelid closure was full.  Smile symmetric.   VIII: Hearing intact to voice   IX,X: Voice normal, palate elevates symmetrically    XI: SCM/trap 5/5 bilat   XII: Tongue protrudes midline, no atrophy or fasciculations   *Motor:   Normal bulk.  No tremor, rigidity or bradykinesia. No pronator drift.    Strength: Dlt Bic Tri WrE WrF FgS Gr HF KnF KnE PlF DoF    Left 5 5 5 5 5 5 5 5 5 5 5 5     Right 5 5 5 5 5 5 5 5 5 5 5 5     *Sensory: Sensory deficit RUE, otherwise intact. No double-simultaneous extinction.  *Coordination:  Finger-to-nose, heel-to-shin, rapid alternating motions were intact. *Reflexes:  2+ and symmetric throughout without clonus; toes down-going bilat *Gait: normal base, normal stride, normal turn. Negative Romberg.  NIHSS = 1 sensory defcit   Premorbid mRS = 1   Labs    CBC:  Recent Labs  Lab 01/13/21 0258  WBC 6.9  NEUTROABS 3.5  HGB 16.1*  HCT 48.5*  MCV 90.0  PLT 262    Basic Metabolic Panel:  Lab Results  Component Value Date   NA 140 01/13/2021   K 4.4 01/13/2021   CO2 28 01/13/2021   GLUCOSE 94 01/13/2021   BUN 18 01/13/2021   CREATININE 0.82 01/13/2021   CALCIUM 9.1 01/13/2021   GFRNONAA >60 01/13/2021   GFRAA >60 02/13/2014    Urine Drug Screen:     Component Value Date/Time   LABOPIA NONE DETECTED 01/13/2021 0848    COCAINSCRNUR POSITIVE (A) 01/13/2021 0848   LABBENZ NONE DETECTED 01/13/2021 0848   AMPHETMU NONE DETECTED 01/13/2021 0848   THCU NONE DETECTED 01/13/2021 0848   LABBARB NONE DETECTED 01/13/2021 0848    Alcohol Level No results found for: ETH    Impression   This is a 55 year old woman with a  history significant for COPD, tobacco abuse, use of crack cocaine, anxiety who presents with numbness of her right face and right arm for 3 days and 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 She has significant cervical stenosis which NSU will evaluate, per hospitalist they are unlikely to recommend acute intervention. Stroke workup below.  Recommendations   - Goal normotension, avoid hypotension - Continue ASA 81mg  daily. If NSU confirms no surgical intervention at this time, add palvix 75mg  daily x21 days f/b plavix 75mg  daily monotherapy after that. This stroke would be considered an aspirin failure. - CTA H&N pending - TTE w/ bubble pending - Check A1c and LDL + add statin per guidelines - q4 hr neuro checks - STAT head CT for any change in neuro exam - Tele - PT/OT/SLP - Stroke education - Amb referral to neurology upon discharge   Will f/u on above results.   ______________________________________________________________________   Thank you for the opportunity to take part in the care of this patient. If you have any further questions, please contact the neurology consultation attending.  Signed,  , MD [facility] [phone]  If 7pm- 7am, please page neurology on call as listed in AMION.

## 2021-01-13 NOTE — ED Notes (Addendum)
NIH stroke scale complete by this RN due to not complete prior to now for stroke patient.   Pt only has numbness sensation in right side of her face and her right hand.

## 2021-01-13 NOTE — ED Provider Notes (Signed)
7:42 AM Assumed care for off going team.   Blood pressure (!) 116/101, pulse 61, temperature 98 F (36.7 C), temperature source Axillary, resp. rate 16, height 5\' 2"  (1.575 m), weight 53.1 kg, SpO2 99 %.  See their HPI for full report but in brief pending MRIs  MRIs concerning for punctate acute lacunar infarct in the central left thalamus and MRI cervical does show some right-sided cervical disc and endplate degeneration with mild spinal cord mass-effect no cord signal etiology.  We will discussed the hospital team for admission due to the above.         , MD 01/13/21 1539

## 2021-01-13 NOTE — ED Notes (Signed)
MD Niu at bedside  

## 2021-01-14 ENCOUNTER — Observation Stay (HOSPITAL_BASED_OUTPATIENT_CLINIC_OR_DEPARTMENT_OTHER)
Admit: 2021-01-14 | Discharge: 2021-01-14 | Disposition: A | Discharge status: Home / Self Care | Payer: Medicaid Other | Attending: Neurology | Admitting: Neurology

## 2021-01-14 ENCOUNTER — Observation Stay: Payer: Medicaid Other

## 2021-01-14 DIAGNOSIS — I6389 Other cerebral infarction: Secondary | ICD-10-CM | POA: Diagnosis not present

## 2021-01-14 DIAGNOSIS — I639 Cerebral infarction, unspecified: Secondary | ICD-10-CM | POA: Diagnosis not present

## 2021-01-14 LAB — ECHOCARDIOGRAM COMPLETE BUBBLE STUDY
AR max vel: 2.61 cm2
AV Area VTI: 2.49 cm2
AV Area mean vel: 2.59 cm2
AV Mean grad: 4 mmHg
AV Peak grad: 6.7 mmHg
Ao pk vel: 1.29 m/s
Area-P 1/2: 2.87 cm2
S' Lateral: 2.42 cm

## 2021-01-14 LAB — TSH: TSH: 0.682 u[IU]/mL (ref 0.350–4.500)

## 2021-01-14 LAB — HEMOGLOBIN A1C
Hgb A1c MFr Bld: 4.9 % (ref 4.8–5.6)
Mean Plasma Glucose: 93.93 mg/dL

## 2021-01-14 LAB — LIPID PANEL
Cholesterol: 202 mg/dL — ABNORMAL HIGH (ref 0–200)
HDL: 45 mg/dL (ref >40)
LDL Cholesterol: 132 mg/dL — ABNORMAL HIGH (ref 0–99)
Total CHOL/HDL Ratio: 4.5 RATIO
Triglycerides: 125 mg/dL (ref <150)
VLDL: 25 mg/dL (ref 0–40)

## 2021-01-14 LAB — HIV ANTIBODY (ROUTINE TESTING W REFLEX): HIV Screen 4th Generation wRfx: NONREACTIVE

## 2021-01-14 IMAGING — US US THYROID
1 series · 13 of 25 positions shown · non-contrast
Comparison: CT [DATE]

CLINICAL DATA: Thyroid nodules.

EXAM:
THYROID ULTRASOUND
TECHNIQUE: Ultrasound examination of the thyroid gland and adjacent soft
tissues was performed.

[Series 1: us thyroid · 13 of 42 slices shown]
[im 1/42]
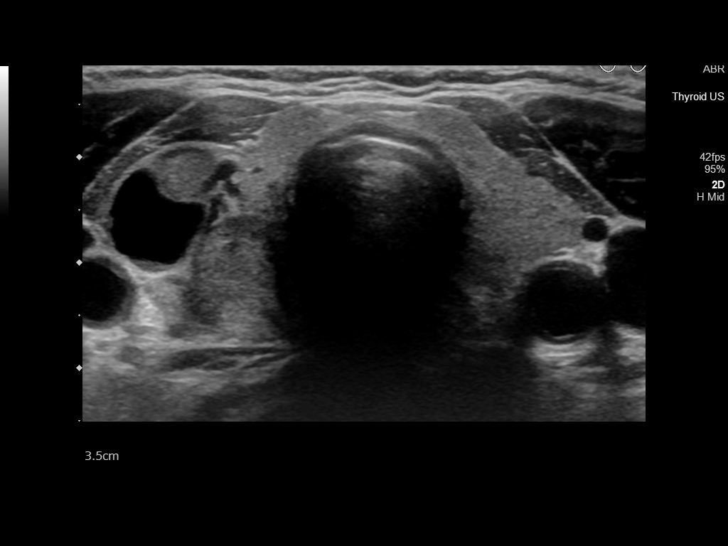
[im 4/42]
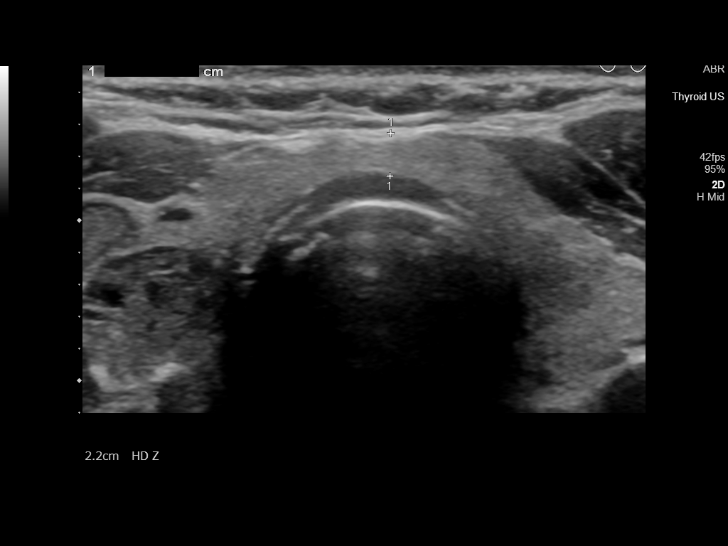
[im 7/42]
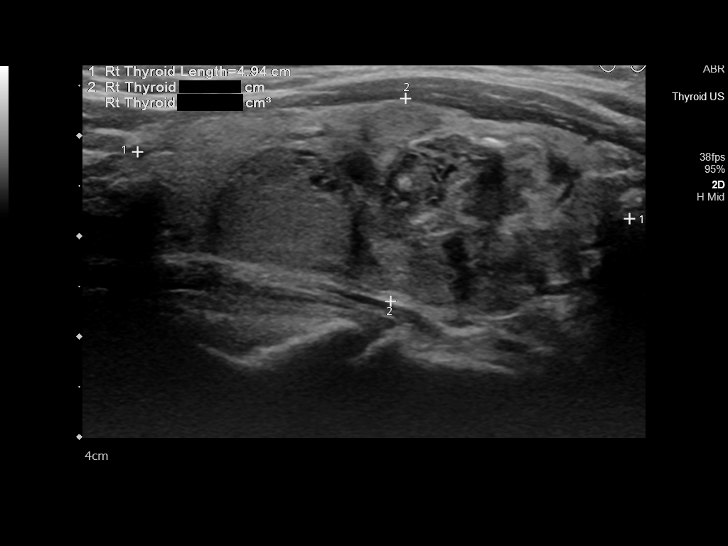
[im 11/42]
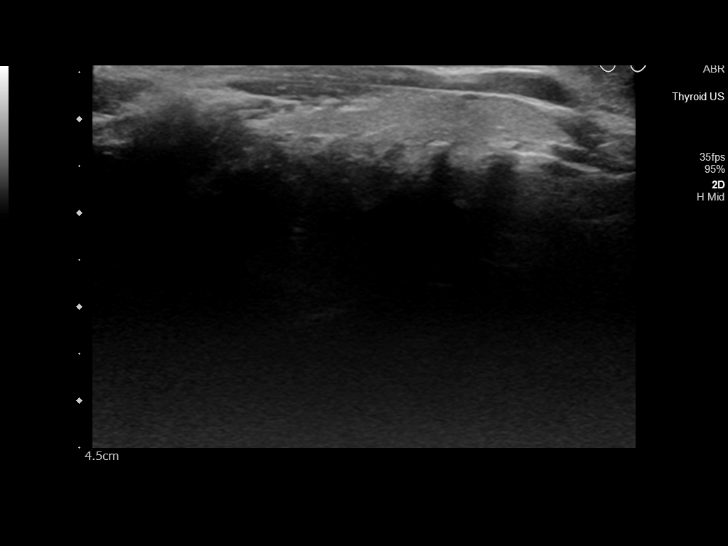
[im 14/42]
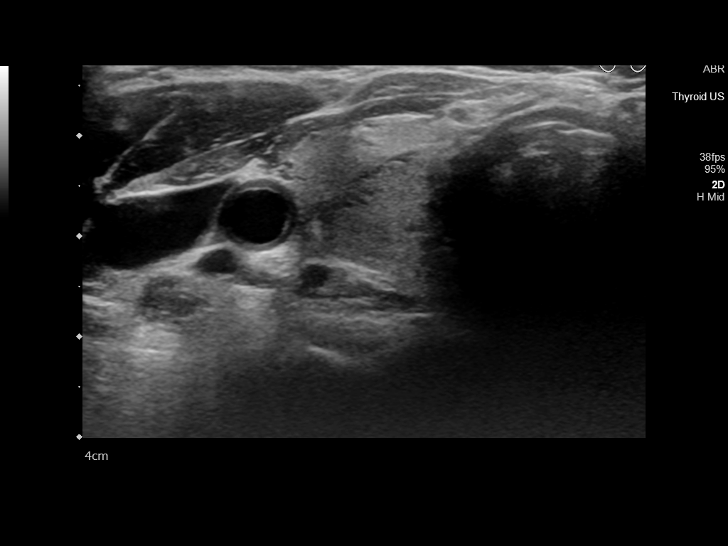
[im 18/42]
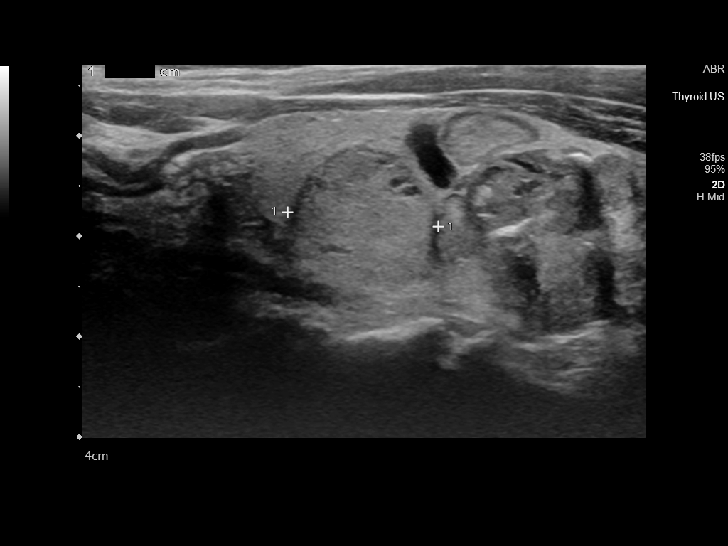
[im 21/42]
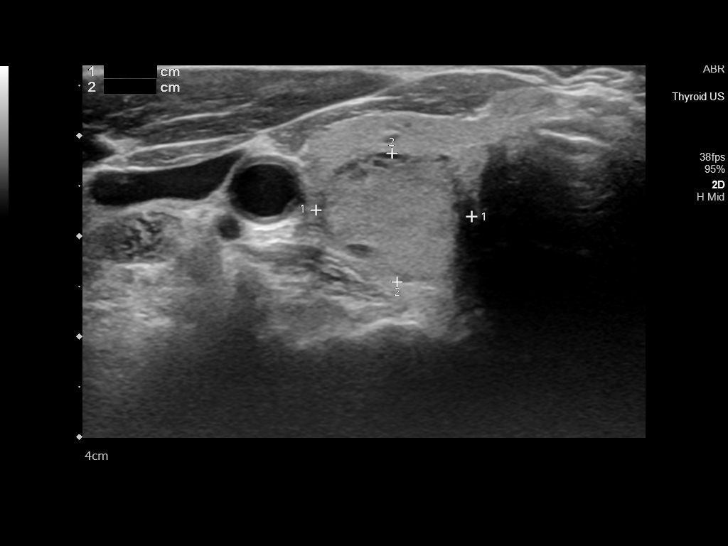
[im 24/42]
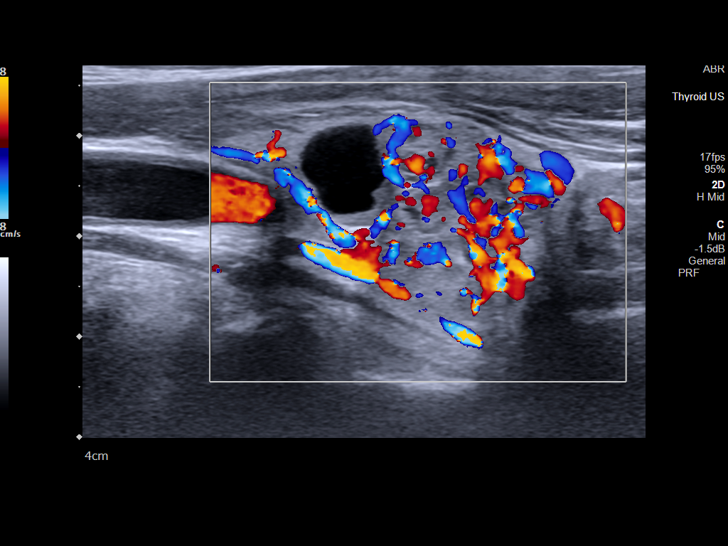
[im 28/42]
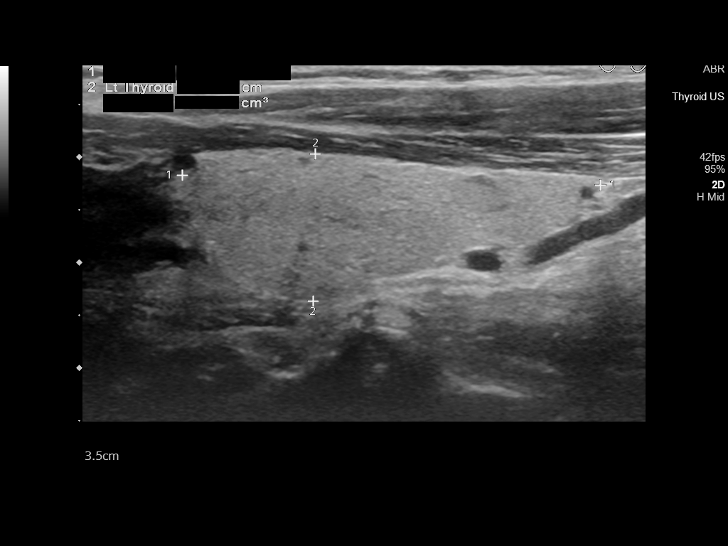
[im 31/42]
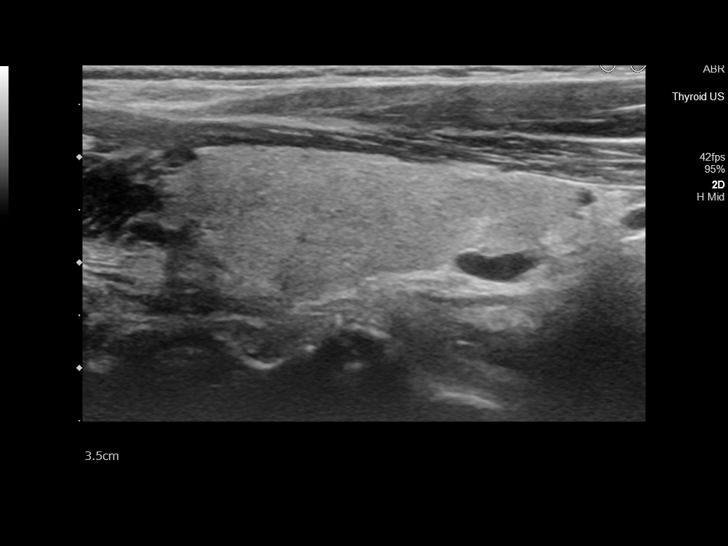
[im 35/42]
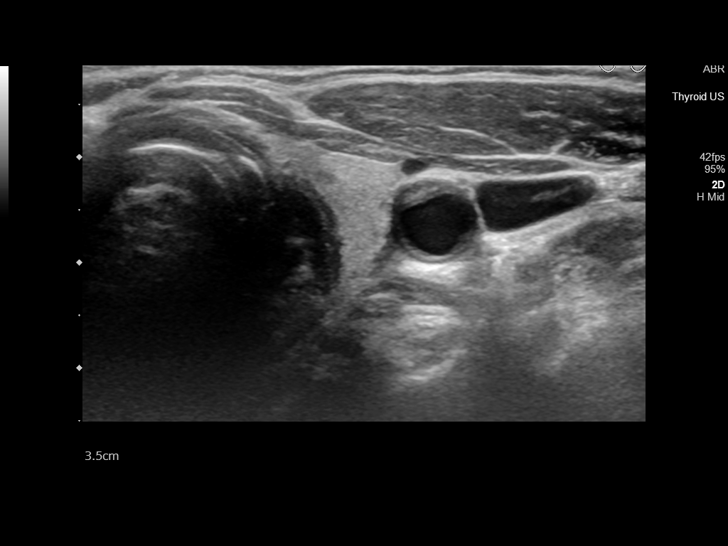
[im 38/42]
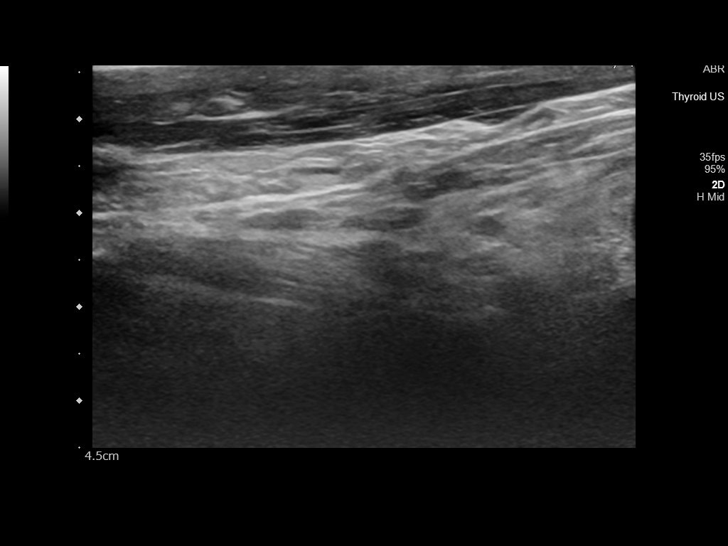
[im 42/42]
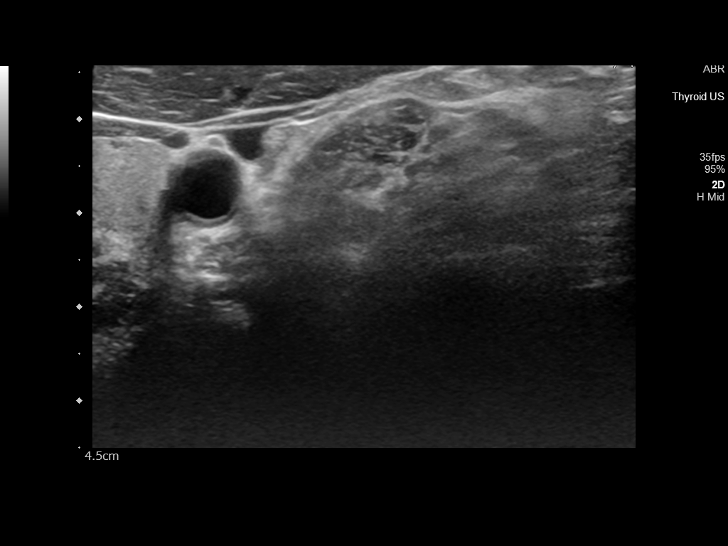

[13 of 25 positions shown; findings below may reference images not displayed]

FINDINGS: Parenchymal Echotexture: Normal

Isthmus: 0.3 cm

Right lobe: 4.9 x 2.0 x 1.9 cm

Left lobe: 4.0 x 1.4 x 1.4 cm

_________________________________________________________

Estimated total number of nodules >/= 1 cm: 2

Number of spongiform nodules >/=  2 cm not described below (TR1): 0

Number of mixed cystic and solid nodules >/= 1.5 cm not described
below (TR2): 0

_________________________________________________________

Nodule # 1:

Location: Right; Mid

Maximum size: 1.6 cm; Other 2 dimensions: 1.5 x 1.3 cm

Composition: solid/almost completely solid (2)

Echogenicity: isoechoic (1)

Shape: not taller-than-wide (0)

Margins: smooth (0)

Echogenic foci: none (0)

ACR TI-RADS total points: 3.

ACR TI-RADS risk category: TR3 (3 points).

ACR TI-RADS recommendations:

*Given size (>/= 1.5 - 2.4 cm) and appearance, a follow-up
ultrasound in 1 year should be considered based on TI-RADS criteria.

Nodule # 2:

Location: Right; Inferior

Maximum size: 2.7 cm; Other 2 dimensions: 2.0 x 2.2 cm

Composition: solid/almost completely solid (2)

Echogenicity: hypoechoic (2)

Shape: not taller-than-wide (0)

Margins: lobulated/irregular (2)

Echogenic foci: macrocalcifications (1)

ACR TI-RADS total points: 7.

ACR TI-RADS risk category: TR5 (>/= 7 points).

ACR TI-RADS recommendations:

**Given size (>/= 1.0 cm) and appearance, fine needle aspiration of
this highly suspicious nodule should be considered based on TI-RADS
criteria.

_________________________________________________________

No suspicious left thyroid nodules.

No enlarged lymph nodes around the thyroid tissue.

_________________________________________________________
IMPRESSION: 1. Nodule #2 in the right inferior thyroid measures up to 2.7 cm.
This is a TR 5 nodule and recommend ultrasound-guided biopsy.
2. Nodule #1 in the right mid thyroid lobe measures up to 1.6 cm.
This is a TR 3 nodule and recommend 1 year follow-up.

The above is in keeping with the ACR TI-RADS recommendations - [HOSPITAL] [JD];[DATE].

## 2021-01-14 MED ORDER — NICOTINE 21 MG/24HR TD PT24: 21.0000 mg | MEDICATED_PATCH | Freq: Every day | TRANSDERMAL | 0 refills | Status: AC

## 2021-01-14 MED ORDER — ATORVASTATIN CALCIUM 80 MG PO TABS: 80.0000 mg | ORAL_TABLET | Freq: Every day | ORAL | 1 refills | Status: AC

## 2021-01-14 MED ORDER — ASPIRIN 81 MG PO CHEW: 81.0000 mg | CHEWABLE_TABLET | Freq: Every day | ORAL | 0 refills | Status: AC

## 2021-01-14 MED ORDER — CLOPIDOGREL BISULFATE 75 MG PO TABS: 75.0000 mg | ORAL_TABLET | Freq: Every day | ORAL | 1 refills | Status: AC

## 2021-01-14 NOTE — Evaluation (Signed)
Physical Therapy Evaluation Patient Details Name: Maureen Cook MRN: [id number] DOB: [birth year] Today's Date: 01/14/2021  History of Present Illness  Pt is a 55 y.o. female presenting to hospital 9/12 with c/o R sided facial numbness and waxing/waning R arm numbness down to her fingertips.  Pt admitted with stroke (punctate acute lacunar infarct in central L thalamus) and cervical spinal stenosis (MRI of c-spine showed widespread cervical disc and endplate degeneration with mild spinal cord mass effect but no cord signal abnormality--per Nuerosurgery no surgery indicated at this time).  PMH includes COPD, tobacco abuse, anxiety, use of crack cocaine.  Clinical Impression  Pt reporting less R facial numbness and improved numbness in R UE (numbness only in fingertips of R hand).  During session pt independent with bed mobility, independent with transfers, independent ambulating 300 feet (no AD), and modified independent ambulating 6 steps with R railing.  No loss of balance noted during ambulation with head turns R/L/up/down, increasing/decreasing speed, and turning and stopping; no loss of balance noted during sessions activities.  No acute PT needs identified; will sign off.  Please re-consult PT if pt's status changes and acute PT needs are identified.     Recommendations for follow up therapy are one component of a multi-disciplinary discharge planning process, led by the attending physician.  Recommendations may be updated based on patient status, additional functional criteria and insurance authorization.  Follow Up Recommendations No PT follow up    Equipment Recommendations  None recommended by PT    Recommendations for Other Services       Precautions / Restrictions Precautions Precautions: None Restrictions Weight Bearing Restrictions: No      Mobility  Bed Mobility Overal bed mobility: Independent             General bed mobility comments: no difficulties noted     Transfers Overall transfer level: Independent Equipment used: None             General transfer comment: steady safe transfers noted  Ambulation/Gait Ambulation/Gait assistance: Independent Gait Distance (Feet): 300 Feet Assistive device: None Gait Pattern/deviations: WFL(Within Functional Limits)     General Gait Details: steady ambulation  Stairs Stairs: Yes Stairs assistance: Modified independent (Device/Increase time) Stair Management: One rail Right;Alternating pattern;Forwards Number of Stairs: 6 General stair comments: steady safe stairs navigation  Wheelchair Mobility    Modified Rankin (Stroke Patients Only)       Balance Overall balance assessment: Independent                                           Pertinent Vitals/Pain Pain Assessment: No/denies pain.  HR 55-65 bpm during sessions activities.  O2 sats WFL on room air.    Home Living Family/patient expects to be discharged to:: Private residence Living Arrangements: Spouse/significant other;Other (Comment) (pt's fiance and female roommate) Available Help at Discharge: Family;Friend(s);Available PRN/intermittently Type of Home: House Home Access: Stairs to enter Entrance Stairs-Rails: Right;Left;Can reach both Entrance Stairs-Number of Steps: 3 Home Layout: One level Home Equipment: None      Prior Function Level of Independence: Independent         Comments: No h/o recent falls     Hand Dominance   Dominant Hand: Right    Extremity/Trunk Assessment   Upper Extremity Assessment Upper Extremity Assessment: Defer to OT evaluation RUE Deficits / Details: Per OT eval today "Pt is has  functional strength and coordination . She reports numbness in finger tips with decreased dexterity and coordination noted."    Lower Extremity Assessment Lower Extremity Assessment: Overall WFL for tasks assessed (Intact B LE strength, light touch sensation, heel to shin coordination,  proprioception, and tone)    Cervical / Trunk Assessment Cervical / Trunk Assessment: Normal  Communication   Communication: No difficulties  Cognition Arousal/Alertness: Awake/alert Behavior During Therapy: WFL for tasks assessed/performed Overall Cognitive Status: Within Functional Limits for tasks assessed                                        General Comments  Nursing cleared pt for participation in physical therapy.  Pt agreeable to PT session.    Exercises     Assessment/Plan    PT Assessment Patent does not need any further PT services  PT Problem List         PT Treatment Interventions      PT Goals (Current goals can be found in the Care Plan section)  Acute Rehab PT Goals Patient Stated Goal: to go home today PT Goal Formulation: With patient Time For Goal Achievement: 01/28/21 Potential to Achieve Goals: Good    Frequency     Barriers to discharge        Co-evaluation               AM-PAC PT "6 Clicks" Mobility  Outcome Measure Help needed turning from your back to your side while in a flat bed without using bedrails?: None Help needed moving from lying on your back to sitting on the side of a flat bed without using bedrails?: None Help needed moving to and from a bed to a chair (including a wheelchair)?: None Help needed standing up from a chair using your arms (e.g., wheelchair or bedside chair)?: None Help needed to walk in hospital room?: None Help needed climbing 3-5 steps with a railing? : None 6 Click Score: 24    End of Session Equipment Utilized During Treatment: Gait belt Activity Tolerance: Patient tolerated treatment well Patient left: in bed;with call bell/phone within reach Nurse Communication: Mobility status PT Visit Diagnosis: Other symptoms and signs involving the nervous system (R29.898)    Time: 6644-0347 PT Time Calculation (min) (ACUTE ONLY): 19 min   Charges:   PT Evaluation $PT Eval Low  Complexity: 1 Low        Somer Trotter, PT 01/14/21, 1:19 PM

## 2021-01-14 NOTE — Evaluation (Signed)
Occupational Therapy Evaluation Patient Details Name: ASHAYA RAFTERY MRN: [id number] DOB: [birth year] Today's Date: 01/14/2021   History of Present Illness 55 year old woman with a history significant for COPD, tobacco abuse, use of crack cocaine, anxiety who presents with numbness of her right face and right arm for 3 days.  The numbness in both her right arm and face are slightly improved but still persist.  She denies weakness in her face or any extremities.  She has no facial droop, no slurred speech, no word finding difficulties, no difficulty swallowing.  No signs or symptoms of infection.  MRI of the C-spine showed stenosis at C3-C6 with mild mass-effect.  Neurosurgery was consulted but does not the patient needs acute intervention   Clinical Impression   Pt was agreeable to OT evaluation this session. Pt is alert, cooperative, and motivated to return home. Pt reports living at home alone and Ind in self care tasks, IADLs, and mobility without use of AD. Pt demonstrates these things during session without physical assistance and no LOB this session. OT does discuss stroke education signs/symptoms and factors that increase chance of CVA along with secondary stroke risk. Pt very receptive to information. Pt reports only residual symptoms at this time being numbness in face and R fingertips but pt is functional in all aspects of care. OT encouraged pt to continue use of R UE in normal/dominant way. Pt with no skilled OT need at this time. OT to SIGN OFF.     Recommendations for follow up therapy are one component of a multi-disciplinary discharge planning process, led by the attending physician.  Recommendations may be updated based on patient status, additional functional criteria and insurance authorization.   Follow Up Recommendations  No OT follow up    Equipment Recommendations  None recommended by OT       Precautions / Restrictions Precautions Precautions: None      Mobility Bed  Mobility Overal bed mobility: Independent                  Transfers Overall transfer level: Independent Equipment used: None                  Balance Overall balance assessment: Independent                                         ADL either performed or assessed with clinical judgement   ADL Overall ADL's : Independent                                             Vision Patient Visual Report: No change from baseline              Pertinent Vitals/Pain Pain Assessment: No/denies pain     Hand Dominance Right   Extremity/Trunk Assessment Upper Extremity Assessment Upper Extremity Assessment: Overall WFL for tasks assessed;RUE deficits/detail RUE Deficits / Details: Pt is has functional strength and coordination . She reports numbness in finger tips with decreased dexterity and coordination noted.   Lower Extremity Assessment Lower Extremity Assessment: Overall WFL for tasks assessed       Communication Communication Communication: No difficulties   Cognition Arousal/Alertness: Awake/alert Behavior During Therapy: WFL for tasks assessed/performed Overall Cognitive Status: Within Functional Limits for tasks assessed  Home Living Family/patient expects to be discharged to:: Private residence   Available Help at Discharge: Family;Friend(s);Available PRN/intermittently Type of Home: Mobile home Home Access: Stairs to enter Entrance Stairs-Number of Steps: 5 Entrance Stairs-Rails: Right;Left Home Layout: One level     Bathroom Shower/Tub: Tub/shower unit         Home Equipment: None          Prior Functioning/Environment Level of Independence: Independent                          OT Goals(Current goals can be found in the care plan section) Acute Rehab OT Goals Patient Stated Goal: to go home today OT Goal Formulation: With  patient Time For Goal Achievement: 01/14/21 Potential to Achieve Goals: Good   AM-PAC OT "6 Clicks" Daily Activity     Outcome Measure Help from another person eating meals?: None Help from another person taking care of personal grooming?: None Help from another person toileting, which includes using toliet, bedpan, or urinal?: None Help from another person bathing (including washing, rinsing, drying)?: None Help from another person to put on and taking off regular upper body clothing?: None Help from another person to put on and taking off regular lower body clothing?: None 6 Click Score: 24   End of Session Nurse Communication: Mobility status  Activity Tolerance: Patient tolerated treatment well Patient left: Other (comment) (transport arrived to take pt for testing)                   Time: 0915-0935 OT Time Calculation (min): 20 min Charges:  OT General Charges $OT Visit: 1 Visit OT Evaluation $OT Eval Low Complexity: 1 Low OT Treatments $Self Care/Home Management : 8-22 mins  Jackquline Denmark, MS, OTR/L , CBIS ascom [phone]  01/14/21, 10:13 AM

## 2021-01-14 NOTE — Discharge Summary (Signed)
Physician Discharge Summary  Maureen Cook AVW:[id number] DOB: 02/26/1966 DOA: 01/13/2021  PCP: Patient, No Pcp Per (Inactive)  Admit date: 01/13/2021 Discharge date: 01/14/2021  Admitted From: Home Disposition:  Home  Discharge Condition:Stable CODE STATUS:FULL Diet recommendation: Heart Healthy    Brief/Interim Summary: HPI: Maureen Cook is a 55 y.o. female with medical history significant of COPD, anxiety, tobacco abuse, crack cocaine user, who presents with right facial and right arm numbness.  Patient states that she started having right facial and right arm numbness 3 days ago.  The right arm numbness has been waxing and waning, down to her fingers.  Her right arm numbness has slightly improved, but her left facial numbness has been persistent.  Patient denies weakness in extremities.  No facial droop or slurred speech.  No difficult swallowing.  Patient states her she had one episode of sharp right-sided chest pain below her rib cage, which has resolved.  Currently no active chest pain, cough, shortness of breath.  No fever or chills.  Denies nausea, vomiting, diarrhea or abdominal pain.  No symptoms of UTI. Pt states that she uses crack cocaine.    ED Course: pt was found to have WBC 6.9, INR 0.9, PTT 26, pending COVID PCR, electrolytes renal function okay, temperature normal, blood pressure 98/66, 120/72, heart rate 58, 61, RR 16, oxygen saturation 98% on room air.  CT of head is negative for acute intracranial abnormalities.  Patient is placed on MedSurg bed for obs. Dr. Adriana Simas of neurosurgery and Dr. Selina Cooley of neurology are consulted.  Hospital course: Hospital course remained stable.  She remained hemodynamically stable.  MRI of the brain showed punctate acute lacunar infarct in the central left thalamus, underlying small chronic lacunar infarction in the bilateral caudate nucleus.  MRI cervical spine showed widespread cervical endplate degeneration, mild cervical stenosis and C3-C5, no cord  signal abnormality.  Neurosurgery was consulted for this but as per neurosurgery, there is no indication for surgical management, can follow-up as an outpatient. Stroke work-up initiated.  Neurology was following. Hemoglobin A1c of 5.  LDL of 132.   Echo showed EF of 60 -65 percent, grade 2 diastolic dysfunction, no evidence of intra-arterial shunt, no evidence of intracardiac clot. PT/OT evaluation done, no follow-up recommended. Neurology recommended aspirin Plavix for 3 weeks followed by Plavix monotherapy.  She will follow-up with neurology as an outpatient. CT angio head and neck did not show any hemodynamically significant stenosis but showed multiple thyroid nodules.  Ultrasound of the thyroid gland showed 2.7 cm right inferior thyroid nodule.  We recommend to follow-up with endocrinology as an outpatient for possible need of further work-up/ultrasound-guided biopsy.TSH is normal.I personally called Dr. Pricilla Handler office , endocrinology at [facility]  requesting follow-up appointment and gave patient's information. She is medically stable for discharge to home today.    Discharge Diagnoses:  Principal Problem:   Stroke [facility]) Active Problems:   Tobacco abuse   Anxiety   COPD (chronic obstructive pulmonary disease) (HCC)   Cervical spinal stenosis   Cocaine abuse [facility])    Discharge Instructions  Discharge Instructions     Ambulatory referral to Neurology   Complete by: As directed    An appointment is requested in approximately: 6 wks   Diet - low sodium heart healthy   Complete by: As directed    Discharge instructions   Complete by: As directed    1)Please follow-up with neurology as an outpatient.  You will be called for appointment 2)Follow up with  endocrinology for follow up of your thyroid nodule. Name and number of the provider has been attached.Call for appointment 3)Follow up with a PCP as an outpatient 4)Take prescribed medications as instucted   Increase activity slowly    Complete by: As directed       Allergies as of 01/14/2021       Reactions   Vicodin [hydrocodone-acetaminophen] Hives        Medication List     STOP taking these medications    amoxicillin 500 MG tablet Commonly known as: AMOXIL   diazepam 2 MG tablet Commonly known as: VALIUM   ibuprofen 800 MG tablet Commonly known as: ADVIL   traMADol 50 MG tablet Commonly known as: Ultram       TAKE these medications    aspirin 81 MG chewable tablet Chew 1 tablet (81 mg total) by mouth daily. Discontinue after 3 weeks What changed: additional instructions   atorvastatin 80 MG tablet Commonly known as: LIPITOR Take 1 tablet (80 mg total) by mouth daily.   clopidogrel 75 MG tablet Commonly known as: Plavix Take 1 tablet (75 mg total) by mouth daily.   nicotine 21 mg/24hr patch Commonly known as: NICODERM CQ - dosed in mg/24 hours Place 1 patch (21 mg total) onto the skin daily. Start taking on: January 15, 2021        Follow-up Information     Solum, Marlana Salvage, MD. Schedule an appointment as soon as possible for a visit in 1 week(s).   Specialty: Endocrinology Contact information: [address] [facility] [address] [place] [address] [phone]                Allergies  Allergen Reactions   Vicodin [Hydrocodone-Acetaminophen] Hives    Consultations: Neurology   Procedures/Studies: CT ANGIO HEAD NECK W WO CM  Result Date: 01/13/2021 CLINICAL DATA:  Neuro deficit, acute, stroke suspected. EXAM: CT ANGIOGRAPHY HEAD AND NECK TECHNIQUE: Multidetector CT imaging of the head and neck was performed using the standard protocol during bolus administration of intravenous contrast. Multiplanar CT image reconstructions and MIPs were obtained to evaluate the vascular anatomy. Carotid stenosis measurements (when applicable) are obtained utilizing NASCET criteria, using the distal internal carotid diameter as the denominator. CONTRAST:  75mL OMNIPAQUE  IOHEXOL 350 MG/ML SOLN COMPARISON:  Head CT and MRI of the brain January 13, 2021 FINDINGS: CTA NECK FINDINGS Aortic arch: Common origin of the innominate and left common carotid artery from the aortic arch. Imaged portion shows no evidence of aneurysm or dissection. Calcified plaques are noted in the aortic arch without significant stenosis of the major arch vessel origins. Right carotid system: Mild atherosclerotic changes of the right carotid bifurcation without hemodynamically significant stenosis. Left carotid system: Mild atherosclerotic changes of the left carotid bifurcation without hemodynamically significant stenosis. Vertebral arteries: Calcified atherosclerotic plaque at the origin of the left vertebral artery without hemodynamically significant stenosis. The vertebral arteries otherwise have normal caliber. Skeleton: No acute findings. Other neck: Enlarged multinodular thyroid gland. Ultrasound evaluation recommended. Upper chest: No acute findings Review of the MIP images confirms the above findings CTA HEAD FINDINGS Anterior circulation: Calcified plaques in the bilateral carotid siphons. No significant stenosis or proximal occlusion. A 1-2 mm outpouching from the undersurface of the supraclinoid left ICA most likely represents an infundibulum at the origin of the left posterior communicating artery or, less likely, a small aneurysm. Posterior circulation: No significant stenosis, proximal occlusion, aneurysm, or vascular malformation. Venous sinuses: As permitted by contrast  timing, patent. Anatomic variants: Severely hypoplastic/aplastic right A1/ACA segment with arterial inflow to the bilateral A2 segments from a prominent left A1/ACA segment. Hypoplastic right P1/PCA segment with arterial inflow predominantly from a prominent right posterior communicating artery. Review of the MIP images confirms the above findings IMPRESSION: 1. Minimal atherosclerotic changes in the bilateral carotid  bifurcations without hemodynamically significant stenosis. 2. Mild atherosclerotic changes in the bilateral carotid siphons without hemodynamically significant stenosis. 3. A 1-2 mm outpouching from the undersurface of the supraclinoid left ICA most likely represents an infundibulum at the origin of the left posterior communicating artery or, less likely, a small aneurysm. 4. Multiple thyroid nodules.  Ultrasound evaluation recommended. Electronically Signed   By: Baldemar Lenis M.D.   On: 01/13/2021 16:09   CT HEAD WO CONTRAST  Result Date: 01/13/2021 CLINICAL DATA:  Right facial numbness and right arm numbness EXAM: CT HEAD WITHOUT CONTRAST TECHNIQUE: Contiguous axial images were obtained from the base of the skull through the vertex without intravenous contrast. COMPARISON:  None. FINDINGS: Brain: No evidence of acute infarction, hemorrhage, hydrocephalus, extra-axial collection or mass lesion/mass effect. Vascular: No hyperdense vessel or unexpected calcification. Skull: Normal. Negative for fracture or focal lesion. Sinuses/Orbits: The visualized paranasal sinuses are essentially clear. The mastoid air cells are unopacified. Other: None. IMPRESSION: Normal head CT. Electronically Signed   By: Charline Bills M.D.   On: 01/13/2021 03:21   MR BRAIN WO CONTRAST  Result Date: 01/13/2021 CLINICAL DATA:  55 year old female with right face and hand numbness and tingling for 1 day. TIA. No known injury. EXAM: MRI HEAD WITHOUT CONTRAST TECHNIQUE: Multiplanar, multiecho pulse sequences of the brain and surrounding structures were obtained without intravenous contrast. COMPARISON:  Head CT without contrast 0312 hours today. Cervical spine MRI reported separately. FINDINGS: Brain: Punctate, 3 mm focus of restricted diffusion in the central left thalamus (series 10, image 74). Mild associated T2 and FLAIR hyperintensity. No hemorrhage or mass effect. No other restricted diffusion. Small chronic  lacunar infarcts in the bilateral caudate nuclei, and small perivascular spaces versus chronic lacune in the right lentiform. Mild superimposed additional scattered nonspecific cerebral white matter T2 and FLAIR hyperintensity. Brainstem and cerebellum appear negative. No chronic cerebral blood products identified. No midline shift, mass effect, evidence of mass lesion, ventriculomegaly, extra-axial collection or acute intracranial hemorrhage. Cervicomedullary junction and pituitary are within normal limits. Vascular: Major intracranial vascular flow voids are preserved. Skull and upper cervical spine: Cervical spine detailed separately. Visualized bone marrow signal is within normal limits. Sinuses/Orbits: Negative orbits. Paranasal negative visible scalp and face. Sinuses and mastoids are stable and well aerated. Other: Visible internal auditory structures appear normal. IMPRESSION: 1. Punctate acute lacunar infarct in the central left thalamus. No associated hemorrhage or mass effect. 2. Underlying small chronic lacunar infarcts in the bilateral caudate nucleus, possibly also the right lentiform. Electronically Signed   By: Odessa Fleming M.D.   On: 01/13/2021 07:32   MR Cervical Spine Wo Contrast  Result Date: 01/13/2021 CLINICAL DATA:  55 year old female with right face and hand numbness and tingling for 1 day. TIA. No known injury. EXAM: MRI CERVICAL SPINE WITHOUT CONTRAST TECHNIQUE: Multiplanar, multisequence MR imaging of the cervical spine was performed. No intravenous contrast was administered. COMPARISON:  Brain MRI today. FINDINGS: Alignment: Straightening of cervical lordosis. No spondylolisthesis. Vertebrae: Widespread degenerative endplate marrow changes in the cervical spine. Normal background bone marrow signal. Benign hemangioma suspected in the left T2 lamina and spinous process, with a stippled T2  and STIR appearance despite decreased T1 signal. No marrow edema or evidence of acute osseous  abnormality in the cervical spine. Cord: No cervical spinal cord signal abnormality despite multilevel degenerative mass effect. Capacious visible upper thoracic spinal canal and negative visible upper thoracic cord. Posterior Fossa, vertebral arteries, paraspinal tissues: Cervicomedullary junction is within normal limits. Negative visible posterior fossa. Preserved major vascular flow voids in the neck. Codominant vertebrals. Negative visible neck soft tissues and lung apices. Disc levels: C2-C3: Minimal disc bulging. Mild ligament flavum hypertrophy. No stenosis. C3-C4: Disc space loss with circumferential mildly lobulated disc osteophyte complex. Mild facet hypertrophy greater on the right. Mild spinal stenosis. No cord mass effect. Mild to moderate right C4 foraminal stenosis. C4-C5: Disc space loss with circumferential disc osteophyte complex. Mild facet and ligament flavum hypertrophy. Mild spinal stenosis and spinal cord mass effect. Moderate bilateral C5 foraminal stenosis. C5-C6: Circumferential disc osteophyte complex. Mild spinal stenosis. Mild if any cord mass effect. Moderate bilateral C6 foraminal stenosis. C6-C7: Disc space loss and circumferential disc osteophyte complex. Moderate ligament flavum hypertrophy. Mild facet hypertrophy. No convincing cord mass effect. Moderate to severe bilateral C7 foraminal stenosis. C7-T1:  Mild facet hypertrophy.  No stenosis. No upper thoracic stenosis. IMPRESSION: 1. Widespread cervical disc and endplate degeneration. Mild spinal stenosis C3-C4 through C5-C6 with up to mild spinal cord mass effect, but no cord signal abnormality. 2. Widespread moderate degenerative cervical neural foraminal stenosis, up to severe at the bilateral C7 nerve levels. Electronically Signed   By: Odessa Fleming M.D.   On: 01/13/2021 07:37   US THYROID  Result Date: 01/14/2021 CLINICAL DATA:  Thyroid nodules. EXAM: THYROID ULTRASOUND TECHNIQUE: Ultrasound examination of the thyroid gland and  adjacent soft tissues was performed. COMPARISON:  CT 01/13/2021 FINDINGS: Parenchymal Echotexture: Normal Isthmus: 0.3 cm Right lobe: 4.9 x 2.0 x 1.9 cm Left lobe: 4.0 x 1.4 x 1.4 cm _________________________________________________________ Estimated total number of nodules >/= 1 cm: 2 Number of spongiform nodules >/=  2 cm not described below (TR1): 0 Number of mixed cystic and solid nodules >/= 1.5 cm not described below (TR2): 0 _________________________________________________________ Nodule # 1: Location: Right; Mid Maximum size: 1.6 cm; Other 2 dimensions: 1.5 x 1.3 cm Composition: solid/almost completely solid (2) Echogenicity: isoechoic (1) Shape: not taller-than-wide (0) Margins: smooth (0) Echogenic foci: none (0) ACR TI-RADS total points: 3. ACR TI-RADS risk category: TR3 (3 points). ACR TI-RADS recommendations: *Given size (>/= 1.5 - 2.4 cm) and appearance, a follow-up ultrasound in 1 year should be considered based on TI-RADS criteria. Nodule # 2: Location: Right; Inferior Maximum size: 2.7 cm; Other 2 dimensions: 2.0 x 2.2 cm Composition: solid/almost completely solid (2) Echogenicity: hypoechoic (2) Shape: not taller-than-wide (0) Margins: lobulated/irregular (2) Echogenic foci: macrocalcifications (1) ACR TI-RADS total points: 7. ACR TI-RADS risk category: TR5 (>/= 7 points). ACR TI-RADS recommendations: **Given size (>/= 1.0 cm) and appearance, fine needle aspiration of this highly suspicious nodule should be considered based on TI-RADS criteria. _________________________________________________________ No suspicious left thyroid nodules. No enlarged lymph nodes around the thyroid tissue. _________________________________________________________ IMPRESSION: 1. Nodule #2 in the right inferior thyroid measures up to 2.7 cm. This is a TR 5 nodule and recommend ultrasound-guided biopsy. 2. Nodule #1 in the right mid thyroid lobe measures up to 1.6 cm. This is a TR 3 nodule and recommend 1 year  follow-up. The above is in keeping with the ACR TI-RADS recommendations - J Am Coll Radiol 2017;14:587-595. Electronically Signed   By: Richarda Overlie  M.D.   On: 01/14/2021 10:20   ECHOCARDIOGRAM COMPLETE BUBBLE STUDY  Result Date: 01/14/2021    ECHOCARDIOGRAM REPORT   Patient Name:   Maureen Cook Date of Exam: 01/14/2021 Medical Rec #:  [id number]    Height:       62.0 in Accession #:    [id number]   Weight:       117.0 lb Date of Birth:  1965/07/24    BSA:          1.522 m Patient Age:    55 years     BP:           118/57 mmHg Patient Gender: F            HR:           48 bpm. Exam Location:  [facility] Procedure: 2D Echo, Color Doppler, Cardiac Doppler and Saline Contrast Bubble            Study Indications:     Stroke 434.91/ I63.9  History:         Patient has no prior history of Echocardiogram examinations.                  COPD. Tobacco abuse.  Sonographer:     Cristela Blue Referring Phys:  [id number] Malachi Carl STACK Diagnosing Phys: Yvonne Kendall MD IMPRESSIONS  1. Left ventricular ejection fraction, by estimation, is 60 to 65%. The left ventricle has normal function. The left ventricle has no regional wall motion abnormalities. There is mild left ventricular hypertrophy. Left ventricular diastolic parameters are consistent with Grade II diastolic dysfunction (pseudonormalization).  2. Right ventricular systolic function is normal. The right ventricular size is normal. There is normal pulmonary artery systolic pressure.  3. The mitral valve is normal in structure. Trivial mitral valve regurgitation. No evidence of mitral stenosis.  4. The aortic valve was not well visualized. Aortic valve regurgitation is not visualized. No aortic stenosis is present.  5. The inferior vena cava is normal in size with greater than 50% respiratory variability, suggesting right atrial pressure of 3 mmHg.  6. Agitated saline contrast bubble study was negative, with no evidence of any interatrial shunt. FINDINGS  Left Ventricle: Left  ventricular ejection fraction, by estimation, is 60 to 65%. The left ventricle has normal function. The left ventricle has no regional wall motion abnormalities. The left ventricular internal cavity size was normal in size. There is  mild left ventricular hypertrophy. Left ventricular diastolic parameters are consistent with Grade II diastolic dysfunction (pseudonormalization). Right Ventricle: The right ventricular size is normal. No increase in right ventricular wall thickness. Right ventricular systolic function is normal. There is normal pulmonary artery systolic pressure. The tricuspid regurgitant velocity is 2.76 m/s, and  with an assumed right atrial pressure of 3 mmHg, the estimated right ventricular systolic pressure is 33.5 mmHg. Left Atrium: Left atrial size was normal in size. Right Atrium: Right atrial size was normal in size. Pericardium: There is no evidence of pericardial effusion. Mitral Valve: The mitral valve is normal in structure. Trivial mitral valve regurgitation. No evidence of mitral valve stenosis. Tricuspid Valve: The tricuspid valve is not well visualized. Tricuspid valve regurgitation is trivial. Aortic Valve: The aortic valve was not well visualized. Aortic valve regurgitation is not visualized. No aortic stenosis is present. Aortic valve mean gradient measures 4.0 mmHg. Aortic valve peak gradient measures 6.7 mmHg. Aortic valve area, by VTI measures 2.49 cm. Pulmonic Valve: The pulmonic valve was not well visualized.  Pulmonic valve regurgitation is not visualized. No evidence of pulmonic stenosis. Aorta: The aortic root is normal in size and structure. Pulmonary Artery: The pulmonary artery is not well seen. Venous: The inferior vena cava is normal in size with greater than 50% respiratory variability, suggesting right atrial pressure of 3 mmHg. IAS/Shunts: No atrial level shunt detected by color flow Doppler. Agitated saline contrast was given intravenously to evaluate for  intracardiac shunting. Agitated saline contrast bubble study was negative, with no evidence of any interatrial shunt.  LEFT VENTRICLE PLAX 2D LVIDd:         4.30 cm  Diastology LVIDs:         2.42 cm  LV e' medial:    5.11 cm/s LV PW:         1.00 cm  LV E/e' medial:  15.3 LV IVS:        1.06 cm  LV e' lateral:   10.60 cm/s LVOT diam:     2.00 cm  LV E/e' lateral: 7.4 LV SV:         77 LV SV Index:   51 LVOT Area:     3.14 cm  RIGHT VENTRICLE RV Basal diam:  3.32 cm RV S prime:     11.40 cm/s TAPSE (M-mode): 2.4 cm LEFT ATRIUM             Index       RIGHT ATRIUM           Index LA diam:        3.50 cm 2.30 cm/m  RA Area:     12.50 cm LA Vol (A2C):   45.6 ml 29.96 ml/m RA Volume:   31.30 ml  20.56 ml/m LA Vol (A4C):   28.3 ml 18.59 ml/m LA Biplane Vol: 39.0 ml 25.62 ml/m  AORTIC VALVE                   PULMONIC VALVE AV Area (Vmax):    2.61 cm    PV Vmax:        0.81 m/s AV Area (Vmean):   2.59 cm    PV Peak grad:   2.7 mmHg AV Area (VTI):     2.49 cm    RVOT Peak grad: 2 mmHg AV Vmax:           129.00 cm/s AV Vmean:          90.100 cm/s AV VTI:            0.309 m AV Peak Grad:      6.7 mmHg AV Mean Grad:      4.0 mmHg LVOT Vmax:         107.00 cm/s LVOT Vmean:        74.300 cm/s LVOT VTI:          0.245 m LVOT/AV VTI ratio: 0.79  AORTA Ao Root diam: 3.10 cm MITRAL VALVE               TRICUSPID VALVE MV Area (PHT): 2.87 cm    TR Peak grad:   30.5 mmHg MV Decel Time: 264 msec    TR Vmax:        276.00 cm/s MV E velocity: 78.40 cm/s MV A velocity: 54.80 cm/s  SHUNTS MV E/A ratio:  1.43        Systemic VTI:  0.24 m  Systemic Diam: 2.00 cm Yvonne Kendall MD Electronically signed by Yvonne Kendall MD Signature Date/Time: 01/14/2021/11:16:38 AM    Final       Subjective: Patient seen and examined at the bedside this morning.  Medically stable for discharge today.  Discharge Exam: Vitals:   01/14/21 0700 01/14/21 1150  BP: (!) 118/57 (!) 95/46  Pulse: (!) 48 (!) 53  Resp:  18 16  Temp: 97.8 F (36.6 C) 98 F (36.7 C)  SpO2: 99% 98%   Vitals:   01/14/21 0055 01/14/21 0443 01/14/21 0700 01/14/21 1150  BP: 122/70 (!) 113/52 (!) 118/57 (!) 95/46  Pulse: (!) 49 (!) 50 (!) 48 (!) 53  Resp: Temp: 97.6 F (36.4 C) 97.8 F (36.6 C) 97.8 F (36.6 C) 98 F (36.7 C)  TempSrc: Oral Oral Oral Oral  SpO2: 99% 100% 99% 98%  Weight:      Height:        General: Pt is alert, awake, not in acute distress Cardiovascular: RRR, S1/S2 +, no rubs, no gallops Respiratory: CTA bilaterally, no wheezing, no rhonchi Abdominal: Soft, NT, ND, bowel sounds + Extremities: no edema, no cyanosis    The results of significant diagnostics from this hospitalization (including imaging, microbiology, ancillary and laboratory) are listed below for reference.     Microbiology: Recent Results (from the past 240 hour(s))  Resp Panel by RT-PCR (Flu A&B, Covid) Nasopharyngeal Swab     Status: None   Collection Time: 01/13/21  7:54 AM   Specimen: Nasopharyngeal Swab; Nasopharyngeal(NP) swabs in vial transport medium  Result Value Ref Range Status   SARS Coronavirus 2 by RT PCR NEGATIVE NEGATIVE Final    Comment: (NOTE) SARS-CoV-2 target nucleic acids are NOT DETECTED.  The SARS-CoV-2 RNA is generally detectable in upper respiratory specimens during the acute phase of infection. The lowest concentration of SARS-CoV-2 viral copies this assay can detect is 138 copies/mL. A negative result does not preclude SARS-Cov-2 infection and should not be used as the sole basis for treatment or other patient management decisions. A negative result may occur with  improper specimen collection/handling, submission of specimen other than nasopharyngeal swab, presence of viral mutation(s) within the areas targeted by this assay, and inadequate number of viral copies(<138 copies/mL). A negative result must be combined with clinical observations, patient history, and  epidemiological information. The expected result is Negative.  Fact Sheet for Patients:  BloggerCourse.com  Fact Sheet for Healthcare Providers:  SeriousBroker.it  This test is no t yet approved or cleared by the [place] FDA and  has been authorized for detection and/or diagnosis of SARS-CoV-2 by FDA under an Emergency Use Authorization (EUA). This EUA will remain  in effect (meaning this test can be used) for the duration of the COVID-19 declaration under Section 564(b)(1) of the Act, 21 U.S.C.section 360bbb-3(b)(1), unless the authorization is terminated  or revoked sooner.       Influenza A by PCR NEGATIVE NEGATIVE Final   Influenza B by PCR NEGATIVE NEGATIVE Final    Comment: (NOTE) The Xpert Xpress SARS-CoV-2/FLU/RSV plus assay is intended as an aid in the diagnosis of influenza from Nasopharyngeal swab specimens and should not be used as a sole basis for treatment. Nasal washings and aspirates are unacceptable for Xpert Xpress SARS-CoV-2/FLU/RSV testing.  Fact Sheet for Patients: BloggerCourse.com  Fact Sheet for Healthcare Providers: SeriousBroker.it  This test is not yet approved or cleared by the [place] FDA and has been authorized for detection  and/or diagnosis of SARS-CoV-2 by FDA under an Emergency Use Authorization (EUA). This EUA will remain in effect (meaning this test can be used) for the duration of the COVID-19 declaration under Section 564(b)(1) of the Act, 21 U.S.C. section 360bbb-3(b)(1), unless the authorization is terminated or revoked.  Performed at [facility], [address] [place], [place] [address]      Labs: BNP (last 3 results) No results for input(s): BNP in the last 8760 hours. Basic Metabolic Panel: Recent Labs  Lab 01/13/21 0258  NA 140  K 4.4  CL 105  CO2 28  GLUCOSE 94  BUN 18  CREATININE 0.82   CALCIUM 9.1   Liver Function Tests: Recent Labs  Lab 01/13/21 0258  AST 13*  ALT 29  ALKPHOS 75  BILITOT 0.6  PROT 5.9*  ALBUMIN 3.4*   No results for input(s): LIPASE, AMYLASE in the last 168 hours. No results for input(s): AMMONIA in the last 168 hours. CBC: Recent Labs  Lab 01/13/21 0258  WBC 6.9  NEUTROABS 3.5  HGB 16.1*  HCT 48.5*  MCV 90.0  PLT 262   Cardiac Enzymes: No results for input(s): CKTOTAL, CKMB, CKMBINDEX, TROPONINI in the last 168 hours. BNP: Invalid input(s): POCBNP CBG: No results for input(s): GLUCAP in the last 168 hours. D-Dimer No results for input(s): DDIMER in the last 72 hours. Hgb A1c Recent Labs    01/13/21 0258 01/14/21 0400  HGBA1C 5.0 4.9   Lipid Profile Recent Labs    01/13/21 0258 01/14/21 0400  CHOL  --  202*  HDL  --  45  LDLCALC  --  604*  TRIG  --  125  CHOLHDL  --  4.5  LDLDIRECT 162.0*  --    Thyroid function studies Recent Labs    01/14/21 0400  TSH 0.682   Anemia work up No results for input(s): VITAMINB12, FOLATE, FERRITIN, TIBC, IRON, RETICCTPCT in the last 72 hours. Urinalysis    Component Value Date/Time   COLORURINE Amber 02/13/2014 2354   APPEARANCEUR Cloudy 02/13/2014 2354   LABSPEC 1.020 02/13/2014 2354   PHURINE 5.0 02/13/2014 2354   GLUCOSEU Negative 02/13/2014 2354   HGBUR Negative 02/13/2014 2354   BILIRUBINUR Negative 02/13/2014 2354   KETONESUR 1+ 02/13/2014 2354   PROTEINUR Negative 02/13/2014 2354   NITRITE Negative 02/13/2014 2354   LEUKOCYTESUR Negative 02/13/2014 2354   Sepsis Labs Invalid input(s): PROCALCITONIN,  WBC,  LACTICIDVEN Microbiology Recent Results (from the past 240 hour(s))  Resp Panel by RT-PCR (Flu A&B, Covid) Nasopharyngeal Swab     Status: None   Collection Time: 01/13/21  7:54 AM   Specimen: Nasopharyngeal Swab; Nasopharyngeal(NP) swabs in vial transport medium  Result Value Ref Range Status   SARS Coronavirus 2 by RT PCR NEGATIVE NEGATIVE Final     Comment: (NOTE) SARS-CoV-2 target nucleic acids are NOT DETECTED.  The SARS-CoV-2 RNA is generally detectable in upper respiratory specimens during the acute phase of infection. The lowest concentration of SARS-CoV-2 viral copies this assay can detect is 138 copies/mL. A negative result does not preclude SARS-Cov-2 infection and should not be used as the sole basis for treatment or other patient management decisions. A negative result may occur with  improper specimen collection/handling, submission of specimen other than nasopharyngeal swab, presence of viral mutation(s) within the areas targeted by this assay, and inadequate number of viral copies(<138 copies/mL). A negative result must be combined with clinical observations, patient history, and epidemiological information. The expected result is Negative.  Fact Sheet for Patients:  BloggerCourse.com  Fact Sheet for Healthcare Providers:  SeriousBroker.it  This test is no t yet approved or cleared by the [place] FDA and  has been authorized for detection and/or diagnosis of SARS-CoV-2 by FDA under an Emergency Use Authorization (EUA). This EUA will remain  in effect (meaning this test can be used) for the duration of the COVID-19 declaration under Section 564(b)(1) of the Act, 21 U.S.C.section 360bbb-3(b)(1), unless the authorization is terminated  or revoked sooner.       Influenza A by PCR NEGATIVE NEGATIVE Final   Influenza B by PCR NEGATIVE NEGATIVE Final    Comment: (NOTE) The Xpert Xpress SARS-CoV-2/FLU/RSV plus assay is intended as an aid in the diagnosis of influenza from Nasopharyngeal swab specimens and should not be used as a sole basis for treatment. Nasal washings and aspirates are unacceptable for Xpert Xpress SARS-CoV-2/FLU/RSV testing.  Fact Sheet for Patients: BloggerCourse.com  Fact Sheet for Healthcare  Providers: SeriousBroker.it  This test is not yet approved or cleared by the [place] FDA and has been authorized for detection and/or diagnosis of SARS-CoV-2 by FDA under an Emergency Use Authorization (EUA). This EUA will remain in effect (meaning this test can be used) for the duration of the COVID-19 declaration under Section 564(b)(1) of the Act, 21 U.S.C. section 360bbb-3(b)(1), unless the authorization is terminated or revoked.  Performed at [facility], [address] [place], [place] [address]     Please note: You were cared for by a hospitalist during your hospital stay. Once you are discharged, your primary care physician will handle any further medical issues. Please note that NO REFILLS for any discharge medications will be authorized once you are discharged, as it is imperative that you return to your primary care physician (or establish a relationship with a primary care physician if you do not have one) for your post hospital discharge needs so that they can reassess your need for medications and monitor your lab values.    Time coordinating discharge: 40 minutes  SIGNED:   Burnadette Pop, MD  [facility] 01/14/2021, 1:43 PM Pager [phone]  If 7PM-7AM, please contact night-coverage [email or web address] Password [email or web address]

## 2021-01-14 NOTE — Plan of Care (Signed)
Neurology plan of care  CTA H&N  1. Minimal atherosclerotic changes in the bilateral carotid bifurcations without hemodynamically significant stenosis. 2. Mild atherosclerotic changes in the bilateral carotid siphons without hemodynamically significant stenosis. 3. A 1-2 mm outpouching from the undersurface of the supraclinoid left ICA most likely represents an infundibulum at the origin of the left posterior communicating artery or, less likely, a small aneurysm. 4. Multiple thyroid nodules.  Ultrasound evaluation recommended.  Thyroid US ordered by hospitalist shows multiple nodules described by radiology as highly suspicious - mgmt per hospitalist.   No intracardiac clot or other relevant findings on TTE   Stroke w/u now complete. Continue ASA 81mg  daily + plavix 75mg  daily x21 days f/b plavix 75mg  daily monotherapy after that. Add atorvastatin 80mg  daily. I will arrange neuro f/u.  Neurology to sign off.  , MD [facility] [phone]  If 7pm- 7am, please page neurology on call as listed in AMION.

## 2021-01-14 NOTE — Progress Notes (Signed)
SLP Cancellation Note  Patient Details Name: ALLESSANDRA BERNARDI MRN: [id number] DOB: 08-04-1965   Cancelled treatment:       Reason Eval/Treat Not Completed: SLP screened, no needs identified, will sign off (chart reviewed; consulted NSG then met w/ pt). Pt denied any difficulty swallowing and is currently on a regular diet; tolerates swallowing pills w/ water per NSG. Pt conversed in general conversation w/out overt expressive/receptive deficits noted; pt denied any new speech-language deficits. Speech clear. She engaged easily in conversation w/ Staff in room. No further skilled ST services indicated as pt appears at her baseline. Pt agreed. NSG to reconsult if any change in status while admitted.        Orinda Kenner, MS, CCC-SLP Speech Language Pathologist Rehab Services [phone] [facility] 01/14/2021, 8:57 AM

## 2021-01-14 NOTE — Progress Notes (Signed)
Pt d/c to home. Discharge education provided all questions answered. All pt belongings sent home with pt. IV removed and IV intact.

## 2021-01-14 NOTE — TOC Initial Note (Signed)
Transition of Care [facility]) - Initial/Assessment Note    Patient Details  Name: Maureen Cook MRN: [id number] Date of Birth: 06-May-1965  Transition of Care [facility]) CM/SW Contact:    Allayne Butcher, RN Phone Number: 01/14/2021, 1:09 PM  Clinical Narrative:                 Patient placed under observation for stroke.  RNCM received consult for out of state Medicaid and substance abuse.   Patient lives in [place], reports moving here about a month ago.  She lives with her boyfriend and another friend, she is independent and drives.   Patient's Medicaid in [place] is still active and she is concerned that she may not qualify for Medicaid in [place].  Patient verbalizes that she will probably continue to go to [place] for her PCP and prescriptions.  RNCM did inform her that her out of state coverage will not cover her for an [place] Pcp.    Patient expresses no concerns about substance use.   Patient's boyfriend will pick her up at discharge.    Expected Discharge Plan: Home/Self Care Barriers to Discharge: Continued Medical Work up   Patient Goals and CMS Choice Patient states their goals for this hospitalization and ongoing recovery are:: Patient would like to go home today      Expected Discharge Plan and Services Expected Discharge Plan: Home/Self Care   Discharge Planning Services: CM Consult   Living arrangements for the past 2 months: Mobile Home                 DME Arranged: N/A DME Agency: NA       HH Arranged: NA HH Agency: NA        Prior Living Arrangements/Services Living arrangements for the past 2 months: Mobile Home Lives with:: Friends, Significant Other Patient language and need for interpreter reviewed:: Yes Do you feel safe going back to the place where you live?: Yes      Need for Family Participation in Patient Care: Yes (Comment) Care giver support system in place?: Yes (comment) (friend, significant other)   Criminal Activity/Legal Involvement Pertinent to Current  Situation/Hospitalization: No - Comment as needed  Activities of Daily Living Home Assistive Devices/Equipment: None ADL Screening (condition at time of admission) Patient's cognitive ability adequate to safely complete daily activities?: Yes Is the patient deaf or have difficulty hearing?: No Does the patient have difficulty seeing, even when wearing glasses/contacts?: No Does the patient have difficulty concentrating, remembering, or making decisions?: No Patient able to express need for assistance with ADLs?: Yes Does the patient have difficulty dressing or bathing?: No Independently performs ADLs?: Yes (appropriate for developmental age) Does the patient have difficulty walking or climbing stairs?: No Weakness of Legs: None Weakness of Arms/Hands: None  Permission Sought/Granted   Permission granted to share information with : No              Emotional Assessment Appearance:: Appears stated age Attitude/Demeanor/Rapport: Engaged Affect (typically observed): Accepting Orientation: : Oriented to Self, Oriented to Place, Oriented to  Time, Oriented to Situation Alcohol / Substance Use: Illicit Drugs Psych Involvement: No (comment)  Admission diagnosis:  Paresthesia [R20.2] Stroke [facility]) [I63.9] Patient Active Problem List   Diagnosis Date Noted   Tobacco abuse 01/13/2021   Anxiety 01/13/2021   COPD (chronic obstructive pulmonary disease) (HCC) 01/13/2021   Stroke (HCC) 01/13/2021   Cervical spinal stenosis 01/13/2021   Cocaine abuse (HCC) 01/13/2021   PCP:  Patient, No  Pcp Per (Inactive) Pharmacy:   [facility] [address] [place] - [address] [place] [address] Phone: [phone] Fax: [phone]     Social Determinants of Health (SDOH) Interventions    Readmission Risk Interventions No flowsheet data found.

## 2021-02-13 ENCOUNTER — Encounter (HOSPITAL_COMMUNITY): Payer: Self-pay | Admitting: Radiology

## 2021-02-28 ENCOUNTER — Encounter: Payer: Self-pay | Admitting: Neurology

## 2021-02-28 ENCOUNTER — Ambulatory Visit: Payer: Medicaid Other | Admitting: Neurology

## 2021-08-13 ENCOUNTER — Emergency Department: Payer: Medicaid Other

## 2021-08-13 ENCOUNTER — Emergency Department
Admission: EM | Admit: 2021-08-13 | Discharge: 2021-08-13 | Disposition: A | Discharge status: Home / Self Care | Payer: Medicaid Other | Attending: Emergency Medicine | Admitting: Emergency Medicine

## 2021-08-13 ENCOUNTER — Other Ambulatory Visit: Payer: Self-pay

## 2021-08-13 ENCOUNTER — Encounter: Payer: Self-pay | Admitting: Emergency Medicine

## 2021-08-13 DIAGNOSIS — Z87891 Personal history of nicotine dependence: Secondary | ICD-10-CM | POA: Insufficient documentation

## 2021-08-13 DIAGNOSIS — Z7982 Long term (current) use of aspirin: Secondary | ICD-10-CM | POA: Diagnosis not present

## 2021-08-13 DIAGNOSIS — R112 Nausea with vomiting, unspecified: Secondary | ICD-10-CM | POA: Diagnosis not present

## 2021-08-13 DIAGNOSIS — R350 Frequency of micturition: Secondary | ICD-10-CM | POA: Diagnosis not present

## 2021-08-13 DIAGNOSIS — R109 Unspecified abdominal pain: Secondary | ICD-10-CM

## 2021-08-13 DIAGNOSIS — R300 Dysuria: Secondary | ICD-10-CM | POA: Insufficient documentation

## 2021-08-13 DIAGNOSIS — J449 Chronic obstructive pulmonary disease, unspecified: Secondary | ICD-10-CM | POA: Insufficient documentation

## 2021-08-13 DIAGNOSIS — R1032 Left lower quadrant pain: Secondary | ICD-10-CM | POA: Diagnosis present

## 2021-08-13 LAB — COMPREHENSIVE METABOLIC PANEL
ALT: 9 U/L (ref 0–44)
AST: 16 U/L (ref 15–41)
Albumin: 3.6 g/dL (ref 3.5–5.0)
Alkaline Phosphatase: 97 U/L (ref 38–126)
Anion gap: 9 (ref 5–15)
BUN: 21 mg/dL — ABNORMAL HIGH (ref 6–20)
CO2: 23 mmol/L (ref 22–32)
Calcium: 9.2 mg/dL (ref 8.9–10.3)
Chloride: 105 mmol/L (ref 98–111)
Creatinine, Ser: 0.86 mg/dL (ref 0.44–1.00)
GFR, Estimated: >60 mL/min (ref >60)
Glucose, Bld: 92 mg/dL (ref 70–99)
Potassium: 3.6 mmol/L (ref 3.5–5.1)
Sodium: 137 mmol/L (ref 135–145)
Total Bilirubin: 0.6 mg/dL (ref 0.3–1.2)
Total Protein: 6.9 g/dL (ref 6.5–8.1)

## 2021-08-13 LAB — URINALYSIS, ROUTINE W REFLEX MICROSCOPIC
Bilirubin Urine: NEGATIVE
Glucose, UA: NEGATIVE mg/dL
Hgb urine dipstick: NEGATIVE
Ketones, ur: NEGATIVE mg/dL
Leukocytes,Ua: NEGATIVE
Nitrite: NEGATIVE
Protein, ur: NEGATIVE mg/dL
Specific Gravity, Urine: 1.03 (ref 1.005–1.030)
pH: 5 (ref 5.0–8.0)

## 2021-08-13 LAB — CBC WITH DIFFERENTIAL/PLATELET
Abs Immature Granulocytes: 0.04 10*3/uL (ref 0.00–0.07)
Basophils Absolute: 0.1 10*3/uL (ref 0.0–0.1)
Basophils Relative: 1 %
Eosinophils Absolute: 0.2 10*3/uL (ref 0.0–0.5)
Eosinophils Relative: 3 %
HCT: 42.1 % (ref 36.0–46.0)
Hemoglobin: 13.5 g/dL (ref 12.0–15.0)
Immature Granulocytes: 1 %
Lymphocytes Relative: 23 %
Lymphs Abs: 1.7 10*3/uL (ref 0.7–4.0)
MCH: 28.4 pg (ref 26.0–34.0)
MCHC: 32.1 g/dL (ref 30.0–36.0)
MCV: 88.4 fL (ref 80.0–100.0)
Monocytes Absolute: 0.8 10*3/uL (ref 0.1–1.0)
Monocytes Relative: 11 %
Neutro Abs: 4.5 10*3/uL (ref 1.7–7.7)
Neutrophils Relative %: 61 %
Platelets: 225 10*3/uL (ref 150–400)
RBC: 4.76 MIL/uL (ref 3.87–5.11)
RDW: 14.1 % (ref 11.5–15.5)
WBC: 7.3 10*3/uL (ref 4.0–10.5)
nRBC: 0 % (ref 0.0–0.2)

## 2021-08-13 MED ORDER — KETOROLAC TROMETHAMINE 30 MG/ML IJ SOLN
15.0000 mg | Freq: Once | INTRAMUSCULAR | Status: AC
  Administered 2021-08-13: 15 mg via INTRAVENOUS
  Filled 2021-08-13: qty 1

## 2021-08-13 MED ORDER — NAPROXEN 500 MG PO TABS: 500.0000 mg | ORAL_TABLET | Freq: Two times a day (BID) | ORAL | 0 refills | Status: AC

## 2021-08-13 MED ORDER — SODIUM CHLORIDE 0.9 % IV BOLUS
1000.0000 mL | Freq: Once | INTRAVENOUS | Status: AC
  Administered 2021-08-13: 1000 mL via INTRAVENOUS

## 2021-08-13 NOTE — ED Provider Notes (Signed)
? ?[facility] ?Provider Note ? ? ? Event Date/Time  ? First MD Initiated Contact with Patient 08/13/21 0425   ?  (approximate) ? ? ?History  ? ?Flank Pain ? ? ?HPI ? ?Maureen Cook is a 56 y.o. female who presents to the ED from work with a chief complaint of nontraumatic left flank pain x1 day.  Reports urinary frequency and dysuria with episode of nausea/vomiting yesterday.  Today with left flank pain.  Feels like she is not voiding completely. Denies fever, chills, chest pain, shortness of breath, diarrhea. ?  ? ? ?Past Medical History  ? ?Past Medical History:  ?Diagnosis Date  ?? Anxiety   ?? COPD (chronic obstructive pulmonary disease) (HCC)   ?? Tobacco abuse   ? ? ? ?Active Problem List  ? ?Patient Active Problem List  ? Diagnosis Date Noted  ?? Tobacco abuse 01/13/2021  ?? Anxiety 01/13/2021  ?? COPD (chronic obstructive pulmonary disease) (HCC) 01/13/2021  ?? Stroke (HCC) 01/13/2021  ?? Cervical spinal stenosis 01/13/2021  ?? Cocaine abuse (HCC) 01/13/2021  ? ? ? ?Past Surgical History  ? ?Past Surgical History:  ?Procedure Laterality Date  ?? hemoroidectomy  2003/2002  ?? tubiligation    ? ? ? ?Home Medications  ? ?Prior to Admission medications   ?Medication Sig Start Date End Date Taking? Authorizing Provider  ?aspirin 81 MG chewable tablet Chew 1 tablet (81 mg total) by mouth daily. Discontinue after 3 weeks 01/14/21   Burnadette Pop, MD  ?atorvastatin (LIPITOR) 80 MG tablet Take 1 tablet (80 mg total) by mouth daily. 01/14/21   Burnadette Pop, MD  ?clopidogrel (PLAVIX) 75 MG tablet Take 1 tablet (75 mg total) by mouth daily. 01/14/21 01/14/22  Burnadette Pop, MD  ?nicotine (NICODERM CQ - DOSED IN MG/24 HOURS) 21 mg/24hr patch Place 1 patch (21 mg total) onto the skin daily. 01/15/21   Burnadette Pop, MD  ? ? ? ?Allergies  ?Vicodin [hydrocodone-acetaminophen] ? ? ?Family History  ? ?Family History  ?Problem Relation Age of Onset  ?? Diabetes Mother   ?? Hyperlipidemia Mother   ??  Hypertension Mother   ? ? ? ?Physical Exam  ?Triage Vital Signs: ?ED Triage Vitals [08/13/21 0423]  ?Enc Vitals Group  ?   BP   ?   Pulse   ?   Resp   ?   Temp   ?   Temp src   ?   SpO2   ?   Weight 120 lb (54.4 kg)  ?   Height 5\' 2"  (1.575 m)  ?   Head Circumference   ?   Peak Flow   ?   Pain Score 5  ?   Pain Loc   ?   Pain Edu?   ?   Excl. in GC?   ? ? ?Updated Vital Signs: ?BP 126/79 (BP Location: Right Arm)   Pulse 60   Temp 98.6 ?F (37 ?C) (Oral)   Resp 20   Ht 5\' 2"  (1.575 m)   Wt 54.4 kg   LMP  (LMP Unknown)   SpO2 100%   BMI 21.95 kg/m?  ? ? ?General: Awake, mild distress.  ?CV:  RRR.  Good peripheral perfusion.  ?Resp:  Normal effort.  CTA B. ?Abd:  Nontender to light or deep palpation.  Mild left CVAT.  No distention.  ?Other:  No vesicles. ? ? ?ED Results / Procedures / Treatments  ?Labs ?(all labs ordered are listed, but only abnormal results  are displayed) ?Labs Reviewed  ?COMPREHENSIVE METABOLIC PANEL - Abnormal; Notable for the following components:  ?    Result Value  ? BUN 21 (*)   ? All other components within normal limits  ?URINALYSIS, ROUTINE W REFLEX MICROSCOPIC - Abnormal; Notable for the following components:  ? Color, Urine YELLOW (*)   ? APPearance HAZY (*)   ? All other components within normal limits  ?CBC WITH DIFFERENTIAL/PLATELET  ? ? ? ?EKG ? ?None ? ? ?RADIOLOGY ?I have independently visualized and interpreted patient's CT as well as noted the radiology interpretation: ?  ?CT Renal Stone: Unremarkable ? ?Official radiology report(s): ?CT Renal Stone Study ? ?Result Date: 08/13/2021 ?CLINICAL DATA:  56 year old female with history of left-sided flank pain and some hematuria. EXAM: CT ABDOMEN AND PELVIS WITHOUT CONTRAST TECHNIQUE: Multidetector CT imaging of the abdomen and pelvis was performed following the standard protocol without IV contrast. RADIATION DOSE REDUCTION: This exam was performed according to the departmental dose-optimization program which includes automated  exposure control, adjustment of the mA and/or kV according to patient size and/or use of iterative reconstruction technique. COMPARISON:  No priors. FINDINGS: Lower chest: Mild linear scarring in the right lower lobe. Hepatobiliary: No suspicious cystic or solid hepatic lesions are confidently identified on today's noncontrast CT examination. Small calcified granulomas are noted in the superior aspect of the left lobe of the liver. Unenhanced appearance of the gallbladder is unremarkable. Pancreas: No definite pancreatic mass or peripancreatic fluid collections or inflammatory changes are noted on today's noncontrast CT examination. Spleen: Unremarkable. Adrenals/Urinary Tract: There are no abnormal calcifications within the collecting system of either kidney, along the course of either ureter, or within the lumen of the urinary bladder. No hydroureteronephrosis or perinephric stranding to suggest urinary tract obstruction at this time. The unenhanced appearance of the kidneys is unremarkable bilaterally. Unenhanced appearance of the urinary bladder is unremarkable. Bilateral adrenal glands are normal in appearance. Stomach/Bowel: Unenhanced appearance of the stomach is normal. There is no pathologic dilatation of small bowel or colon. A few scattered colonic diverticulae are noted, without surrounding inflammatory changes to clearly indicate an acute diverticulitis at this time. Normal appendix. Vascular/Lymphatic: Atherosclerotic calcifications are noted throughout the abdominal aorta and pelvic vasculature. No lymphadenopathy noted in the abdomen or pelvis. Reproductive: Uterus and ovaries are unremarkable in appearance. Other: No significant volume of ascites.  No pneumoperitoneum. Musculoskeletal: There are no aggressive appearing lytic or blastic lesions noted in the visualized portions of the skeleton. IMPRESSION: 1. No acute findings to account for the patient's symptoms. Specifically, no urinary tract  calculi no findings of urinary tract obstruction. 2. Mild colonic diverticulosis without evidence of acute diverticulitis at this time. 3. Aortic atherosclerosis. Electronically Signed   By: Trudie Reedaniel  Entrikin M.D.   On: 08/13/2021 05:30   ? ? ?PROCEDURES: ? ?Critical Care performed: No ? ?Procedures ? ? ?MEDICATIONS ORDERED IN ED: ?Medications  ?sodium chloride 0.9 % bolus 1,000 mL (1,000 mLs Intravenous New Bag/Given 08/13/21 0501)  ?ketorolac (TORADOL) 30 MG/ML injection 15 mg (15 mg Intravenous Given 08/13/21 0502)  ? ? ? ?IMPRESSION / MDM / ASSESSMENT AND PLAN / ED COURSE  ?I reviewed the triage vital signs and the nursing notes. ?             ?               ?56 year old female who presents with left flank pain. Differential diagnosis includes, but is not limited to, ovarian cyst, ovarian  torsion, acute appendicitis, diverticulitis, urinary tract infection/pyelonephritis, endometriosis, bowel obstruction, colitis, renal colic, gastroenteritis, hernia, fibroids, etc. I have personally reviewed patient's chart and see a hospitalization from 01/13/2021 for paresthesia. ? ? ?Clinical Course as of 08/13/21 0545  ?Wed Aug 13, 2021  ?3212 Bladder scan only 65 mL.  Laboratory results demonstrate normal WBC 7.3, normal creatinine 0.86, unremarkable urine and negative CT scan.  Patient feeling better after IV ketorolac; specifically requested no narcotics.  Will discharge home with prescription for NSAIDs and patient will follow up closely with her PCP.  Strict return precautions given.  Patient verbalizes understanding and agrees with plan of care. [JS]  ?  ?Clinical Course User Index ?[JS] Irean Hong, MD  ? ? ? ?FINAL CLINICAL IMPRESSION(S) / ED DIAGNOSES  ? ?Final diagnoses:  ?Left flank pain  ? ? ? ?Rx / DC Orders  ? ?ED Discharge Orders   ? ? None  ? ?  ? ? ? ?Note:  This document was prepared using Dragon voice recognition software and may include unintentional dictation errors. ?  ?Irean Hong, MD ?08/13/21  [phone] ? ?

## 2021-08-13 NOTE — ED Triage Notes (Signed)
Patient ambulatory to triage with steady gait, without difficulty or distress noted; pt reports left flank pain, nonradiating accomp by some hematuria tonight; denies hx of same ?

## 2021-08-13 NOTE — ED Notes (Signed)
Bladder scanner-65 mls ?

## 2021-08-13 NOTE — Discharge Instructions (Signed)
You may take Naprosyn twice daily as needed for discomfort.  Return to the ER for worsening symptoms, persistent vomiting, rash to your left trunk, difficulty breathing or other concerns. ?

## 2021-08-14 LAB — URINE CULTURE

## 2022-01-15 ENCOUNTER — Emergency Department: Payer: Medicaid Other

## 2022-01-15 ENCOUNTER — Emergency Department
Admission: EM | Admit: 2022-01-15 | Discharge: 2022-01-15 | Disposition: A | Discharge status: Home / Self Care | Payer: Medicaid Other | Attending: Emergency Medicine | Admitting: Emergency Medicine

## 2022-01-15 ENCOUNTER — Other Ambulatory Visit: Payer: Self-pay

## 2022-01-15 DIAGNOSIS — W1789XA Other fall from one level to another, initial encounter: Secondary | ICD-10-CM | POA: Diagnosis not present

## 2022-01-15 DIAGNOSIS — S0990XA Unspecified injury of head, initial encounter: Secondary | ICD-10-CM | POA: Diagnosis not present

## 2022-01-15 DIAGNOSIS — S299XXA Unspecified injury of thorax, initial encounter: Secondary | ICD-10-CM | POA: Diagnosis not present

## 2022-01-15 DIAGNOSIS — S3992XA Unspecified injury of lower back, initial encounter: Secondary | ICD-10-CM | POA: Diagnosis present

## 2022-01-15 DIAGNOSIS — T07XXXA Unspecified multiple injuries, initial encounter: Secondary | ICD-10-CM

## 2022-01-15 DIAGNOSIS — S300XXA Contusion of lower back and pelvis, initial encounter: Secondary | ICD-10-CM | POA: Diagnosis not present

## 2022-01-15 DIAGNOSIS — M25551 Pain in right hip: Secondary | ICD-10-CM | POA: Insufficient documentation

## 2022-01-15 DIAGNOSIS — M25552 Pain in left hip: Secondary | ICD-10-CM | POA: Diagnosis not present

## 2022-01-15 DIAGNOSIS — S5001XA Contusion of right elbow, initial encounter: Secondary | ICD-10-CM

## 2022-01-15 DIAGNOSIS — W19XXXA Unspecified fall, initial encounter: Secondary | ICD-10-CM

## 2022-01-15 MED ORDER — METHOCARBAMOL 500 MG PO TABS: 500.0000 mg | ORAL_TABLET | Freq: Four times a day (QID) | ORAL | 0 refills | Status: AC

## 2022-01-15 MED ORDER — OXYCODONE-ACETAMINOPHEN 5-325 MG PO TABS
1.0000 | ORAL_TABLET | Freq: Once | ORAL | Status: AC
  Administered 2022-01-15: 1 via ORAL
  Filled 2022-01-15: qty 1

## 2022-01-15 MED ORDER — METHOCARBAMOL 500 MG PO TABS
500.0000 mg | ORAL_TABLET | Freq: Once | ORAL | Status: AC
  Administered 2022-01-15: 500 mg via ORAL
  Filled 2022-01-15: qty 1

## 2022-01-15 MED ORDER — MELOXICAM 15 MG PO TABS: 15.0000 mg | ORAL_TABLET | Freq: Every day | ORAL | 0 refills | Status: AC

## 2022-01-15 NOTE — ED Notes (Signed)
Pt verbalized understanding of discharge instructions, prescriptions, and follow-up care instructions.  

## 2022-01-15 NOTE — ED Provider Notes (Signed)
[facility] Provider Note  Patient Contact: 4:55 PM (approximate)   History   Fall   HPI  Maureen Cook is a 56 y.o. female who presents the emergency department complaining of pain after a fall.  Patient states that landlord is not been maintaining her property, she went out onto her porch this morning and fell through approximately 5 feet.  Patient landed on her back.  She reports that she did hit her head but did not lose consciousness.  Her primary pain complaints at this time are bilateral hips, low back, right elbow pain.  Patient denies any visual changes, unilateral weakness, chest pain, shortness of breath.  Patient took Motrin prior to arrival for pain relief.     Physical Exam   Triage Vital Signs: ED Triage Vitals  Enc Vitals Group     BP 01/15/22 1417 133/67     Pulse Rate 01/15/22 1417 (!) 56     Resp 01/15/22 1417 18     Temp 01/15/22 1417 98.1 F (36.7 C)     Temp Source 01/15/22 1417 Oral     SpO2 01/15/22 1417 94 %     Weight 01/15/22 1419 110 lb (49.9 kg)     Height 01/15/22 1419 5\' 2"  (1.575 m)     Head Circumference --      Peak Flow --      Pain Score 01/15/22 1417 9     Pain Loc --      Pain Edu? --      Excl. in GC? --     Most recent vital signs: Vitals:   01/15/22 1417 01/15/22 1606  BP: 133/67 130/70  Pulse: (!) 56 60  Resp: 18 18  Temp: 98.1 F (36.7 C)   SpO2: 94% 95%     General: Alert and in no acute distress. Eyes:  PERRL. EOMI. Head: No acute traumatic findings  Neck: No stridor. No cervical spine tenderness to palpation.  Cardiovascular:  Good peripheral perfusion Respiratory: Normal respiratory effort without tachypnea or retractions. Lungs CTAB. Good air entry to the bases with no decreased or absent breath sounds. Musculoskeletal: Full range of motion to all extremities.  Visualization of lumbar spine reveals no obvious signs of trauma.  Diffuse midline tenderness in the upper lumbar spine without  palpable abnormality or step-off.  No extension into the SI joints.  Examination of bilateral hips reveals no shortening or rotation of the lower extremities.  Diffuse tenderness over the posterior hips over the buttocks bilaterally.  No palpable abnormality.  No other tenderness to the lower extremities.  Sensation and pulses intact distally. Neurologic:  No gross focal neurologic deficits are appreciated.  Cranial nerves II through XII grossly intact. Skin:   No rash noted Other:   ED Results / Procedures / Treatments   Labs (all labs ordered are listed, but only abnormal results are displayed) Labs Reviewed - No data to display   EKG     RADIOLOGY  I personally viewed, evaluated, and interpreted these images as part of my medical decision making, as well as reviewing the written report by the radiologist.  ED Provider Interpretation: Imaging of the patient's head, cervical spine, elbow, lumbar spine and bilateral hips reveals no acute traumatic findings.  Degenerative changes identified in the lumbar spine.  DG Elbow Complete Right  Result Date: 01/15/2022 CLINICAL DATA:  Right elbow pain after fall. EXAM: RIGHT ELBOW - COMPLETE 3+ VIEW COMPARISON:  None Available. FINDINGS: There is  no evidence of fracture, dislocation, or joint effusion. There is no evidence of arthropathy or other focal bone abnormality. Soft tissues are unremarkable. IMPRESSION: Negative. Electronically Signed   By: Lupita Raider M.D.   On: 01/15/2022 17:42   DG Hip Unilat W or Wo Pelvis 2-3 Views Left  Result Date: 01/15/2022 CLINICAL DATA:  Fall with hip pain. EXAM: DG HIP (WITH OR WITHOUT PELVIS) 2-3V RIGHT; DG HIP (WITH OR WITHOUT PELVIS) 2-3V LEFT COMPARISON:  None Available. FINDINGS: Right hip: There is no evidence of hip fracture or dislocation. There is no evidence of arthropathy or other focal bone abnormality. Left hip: There is no evidence of hip fracture or dislocation. There is no evidence of  arthropathy or other focal bone abnormality. IMPRESSION: 1. Negative right hip. 2. Negative left hip. Electronically Signed   By: Darliss Cheney M.D.   On: 01/15/2022 17:41   DG Hip Unilat W or Wo Pelvis 2-3 Views Right  Result Date: 01/15/2022 CLINICAL DATA:  Fall with hip pain. EXAM: DG HIP (WITH OR WITHOUT PELVIS) 2-3V RIGHT; DG HIP (WITH OR WITHOUT PELVIS) 2-3V LEFT COMPARISON:  None Available. FINDINGS: Right hip: There is no evidence of hip fracture or dislocation. There is no evidence of arthropathy or other focal bone abnormality. Left hip: There is no evidence of hip fracture or dislocation. There is no evidence of arthropathy or other focal bone abnormality. IMPRESSION: 1. Negative right hip. 2. Negative left hip. Electronically Signed   By: Darliss Cheney M.D.   On: 01/15/2022 17:41   DG Lumbar Spine Complete  Result Date: 01/15/2022 CLINICAL DATA:  Trauma, fall EXAM: LUMBAR SPINE - COMPLETE 4+ VIEW COMPARISON:  None Available. FINDINGS: No recent fracture is seen. Alignment of the posterior margins of vertebral bodies is unremarkable. There is minimal levoscoliosis. Small anterior bony spurs are noted at multiple levels. Degenerative changes are noted, more so in the facet joints at L4-L5 and L5-S1 levels. There is disc space narrowing at the L4-L5 and L5-S1 levels. Paraspinal soft tissues are unremarkable. IMPRESSION: No recent fracture is seen. Degenerative changes are noted in lumbar spine, more so at L4-L5 and L5-S1 levels. Electronically Signed   By: Ernie Avena M.D.   On: 01/15/2022 15:30   CT Head Wo Contrast  Result Date: 01/15/2022 CLINICAL DATA:  Fall onto back. EXAM: CT HEAD WITHOUT CONTRAST CT CERVICAL SPINE WITHOUT CONTRAST TECHNIQUE: Multidetector CT imaging of the head and cervical spine was performed following the standard protocol without intravenous contrast. Multiplanar CT image reconstructions of the cervical spine were also generated. RADIATION DOSE REDUCTION: This  exam was performed according to the departmental dose-optimization program which includes automated exposure control, adjustment of the mA and/or kV according to patient size and/or use of iterative reconstruction technique. COMPARISON:  CT and MR head 01/13/2021, cervical spine MRI 01/13/2021 FINDINGS: CT HEAD FINDINGS Brain: There is no acute intracranial hemorrhage, extra-axial fluid collection, or acute territorial infarct. Parenchymal volume is normal. The ventricles are normal in size. Gray-white differentiation is preserved. There is small infarct in the left caudate head/lentiform nucleus which is new since 01/13/2021 but still remote in appearance. An additional small remote appearing infarct is seen in the right frontal lobe white matter which is new in the interim. There is no mass lesion.  There is no mass effect or midline shift. Vascular: No hyperdense vessel or unexpected calcification. Skull: Normal. Negative for fracture or focal lesion. Sinuses/Orbits: The imaged paranasal sinuses are clear. The globes and orbits  are unremarkable. Other: None. CT CERVICAL SPINE FINDINGS Alignment: There is reversal of the normal cervical lordosis. There is no antero or retrolisthesis. There is no jumped or perched facet or other evidence of traumatic malalignment. Skull base and vertebrae: Skull base alignment is maintained. Vertebral body heights are preserved. There is no evidence of acute fracture. There is no suspicious osseous lesion. Soft tissues and spinal canal: No prevertebral fluid or swelling. No visible canal hematoma. Disc levels: There is disc space narrowing degenerative endplate change throughout the cervical spine. There is overall mild multilevel facet arthropathy. There is no evidence of high-grade spinal canal stenosis. Upper chest: Assessed on the separately dictated CT chest. Other: The thyroid is enlarged and heterogeneous, previously evaluated by ultrasound. IMPRESSION: 1. No acute  intracranial hemorrhage or calvarial fracture. 2. 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 3. No acute fracture or traumatic malalignment of the cervical spine. Electronically Signed   By: Lesia Hausen M.D.   On: 01/15/2022 15:26   CT Cervical Spine Wo Contrast  Result Date: 01/15/2022 CLINICAL DATA:  Fall onto back. EXAM: CT HEAD WITHOUT CONTRAST CT CERVICAL SPINE WITHOUT CONTRAST TECHNIQUE: Multidetector CT imaging of the head and cervical spine was performed following the standard protocol without intravenous contrast. Multiplanar CT image reconstructions of the cervical spine were also generated. RADIATION DOSE REDUCTION: This exam was performed according to the departmental dose-optimization program which includes automated exposure control, adjustment of the mA and/or kV according to patient size and/or use of iterative reconstruction technique. COMPARISON:  CT and MR head 01/13/2021, cervical spine MRI 01/13/2021 FINDINGS: CT HEAD FINDINGS Brain: There is no acute intracranial hemorrhage, extra-axial fluid collection, or acute territorial infarct. Parenchymal volume is normal. The ventricles are normal in size. Gray-white differentiation is preserved. There is small infarct in the left caudate head/lentiform nucleus which is new since 01/13/2021 but still remote in appearance. An additional small remote appearing infarct is seen in the right frontal lobe white matter which is new in the interim. There is no mass lesion.  There is no mass effect or midline shift. Vascular: No hyperdense vessel or unexpected calcification. Skull: Normal. Negative for fracture or focal lesion. Sinuses/Orbits: The imaged paranasal sinuses are clear. The globes and orbits are unremarkable. Other: None. CT CERVICAL SPINE FINDINGS Alignment: There is reversal of the normal cervical lordosis.  There is no antero or retrolisthesis. There is no jumped or perched facet or other evidence of traumatic malalignment. Skull base and vertebrae: Skull base alignment is maintained. Vertebral body heights are preserved. There is no evidence of acute fracture. There is no suspicious osseous lesion. Soft tissues and spinal canal: No prevertebral fluid or swelling. No visible canal hematoma. Disc levels: There is disc space narrowing degenerative endplate change throughout the cervical spine. There is overall mild multilevel facet arthropathy. There is no evidence of high-grade spinal canal stenosis. Upper chest: Assessed on the separately dictated CT chest. Other: The thyroid is enlarged and heterogeneous, previously evaluated by ultrasound. IMPRESSION: 1. No acute intracranial hemorrhage or calvarial fracture. 2. 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 3. No acute fracture or traumatic malalignment of the cervical spine. Electronically Signed   By: Lesia Hausen M.D.   On: 01/15/2022 15:26   CT Chest Wo Contrast  Result Date:  01/15/2022 CLINICAL DATA:  Back pain after fall. EXAM: CT CHEST WITHOUT CONTRAST TECHNIQUE: Multidetector CT imaging of the chest was performed following the standard protocol without IV contrast. RADIATION DOSE REDUCTION: This exam was performed according to the departmental dose-optimization program which includes automated exposure control, adjustment of the mA and/or kV according to patient size and/or use of iterative reconstruction technique. COMPARISON:  Chest radiograph of March 17, 2020. Thyroid ultrasound of January 14, 2021. FINDINGS: Cardiovascular: 4 cm ascending thoracic aortic aneurysm is noted. Atherosclerosis of thoracic aorta is noted. Normal cardiac size. No pericardial effusion. Coronary artery calcifications are noted.  Mediastinum/Nodes: Right thyroid nodule is noted. No adenopathy is noted. Esophagus is unremarkable. Lungs/Pleura: No pneumothorax or pleural effusion is noted. Mild bibasilar scarring is noted. Upper Abdomen: No acute abnormality. Musculoskeletal: No chest wall mass or suspicious bone lesions identified. IMPRESSION: No definite traumatic injury seen in the chest. 4 cm ascending thoracic aortic aneurysm. Recommend annual imaging followup by CTA or MRA. This recommendation follows 2010 ACCF/AHA/AATS/ACR/ASA/SCA/SCAI/SIR/STS/SVM Guidelines for the Diagnosis and Management of Patients with Thoracic Aortic Disease. Circulation. 2010; 121: Z610-R604. Aortic aneurysm NOS (ICD10-I71.9). Right thyroid nodule is noted. This has been evaluated on previous imaging. (ref: J Am Coll Radiol. 2015 Feb;12(2): 143-50). Coronary artery calcifications are noted. Aortic Atherosclerosis (ICD10-I70.0). Electronically Signed   By: Lupita Raider M.D.   On: 01/15/2022 15:20    PROCEDURES:  Critical Care performed: No  Procedures   MEDICATIONS ORDERED IN ED: Medications  oxyCODONE-acetaminophen (PERCOCET/ROXICET) 5-325 MG per tablet 1 tablet (1 tablet Oral Given 01/15/22 1727)  methocarbamol (ROBAXIN) tablet 500 mg (500 mg Oral Given 01/15/22 1738)     IMPRESSION / MDM / ASSESSMENT AND PLAN / ED COURSE  I reviewed the triage vital signs and the nursing notes.                              Differential diagnosis includes, but is not limited to, fall, elbow fracture, compression fracture, multiple contusions, intracranial hemorrhage, skull fracture, cervical spine fracture  Patient's presentation is most consistent with acute presentation with potential threat to life or bodily function.   Patient's diagnosis is consistent with fall, multiple contusions.  Patient presented to the emergency department after a fall.  Patient states that she was walking out through her door this morning when the floor gave out underneath  and made her fall approximately 5 feet to the height of the foundation.  Patient states that she landed on her back and did hit her head.  Imaging is reassuring at this time with no acute traumatic findings.  Patient will be treated with symptom control medications.  Patient had CT scan of the head, cervical spine as well as multiple x-rays.  At this time patient is stable for discharge with no indication for further work-up..  Patient is given ED precautions to return to the ED for any worsening or new symptoms.        FINAL CLINICAL IMPRESSION(S) / ED DIAGNOSES   Final diagnoses:  Fall, initial encounter  Multiple contusions  Minor head injury, initial encounter  Contusion of right elbow, initial encounter  Lumbar contusion, initial encounter     Rx / DC Orders   ED Discharge Orders          Ordered    meloxicam (MOBIC) 15 MG tablet  Daily        01/15/22 1822    methocarbamol (ROBAXIN) 500  MG tablet  4 times daily        01/15/22 1822             Note:  This document was prepared using Dragon voice recognition software and may include unintentional dictation errors.   Lanette Hampshire 01/15/22 Rickey Primus    Concha Se, MD 01/17/22 [phone]

## 2022-01-15 NOTE — ED Triage Notes (Signed)
Pt states she walked out her door and fell through the floor all the way to the foundation where the floor was missing- pt states it was over a 5 foot fall onto her back- pt is having pain all over, but mostly in her back- pt states it happened around one in the morning- pt states she did hit her head but did not have LOC

## 2022-09-05 ENCOUNTER — Emergency Department
Admission: EM | Admit: 2022-09-05 | Discharge: 2022-09-05 | Disposition: A | Discharge status: Home / Self Care | Payer: Medicaid Other | Attending: Emergency Medicine | Admitting: Emergency Medicine

## 2022-09-05 ENCOUNTER — Emergency Department: Payer: Medicaid Other

## 2022-09-05 ENCOUNTER — Other Ambulatory Visit: Payer: Self-pay

## 2022-09-05 ENCOUNTER — Encounter: Payer: Self-pay | Admitting: Intensive Care

## 2022-09-05 DIAGNOSIS — F1410 Cocaine abuse, uncomplicated: Secondary | ICD-10-CM | POA: Diagnosis not present

## 2022-09-05 DIAGNOSIS — R079 Chest pain, unspecified: Secondary | ICD-10-CM | POA: Diagnosis present

## 2022-09-05 LAB — BASIC METABOLIC PANEL
Anion gap: 9 (ref 5–15)
BUN: 13 mg/dL (ref 6–20)
CO2: 20 mmol/L — ABNORMAL LOW (ref 22–32)
Calcium: 9.1 mg/dL (ref 8.9–10.3)
Chloride: 107 mmol/L (ref 98–111)
Creatinine, Ser: 0.79 mg/dL (ref 0.44–1.00)
GFR, Estimated: >60 mL/min (ref >60)
Glucose, Bld: 132 mg/dL — ABNORMAL HIGH (ref 70–99)
Potassium: 3 mmol/L — ABNORMAL LOW (ref 3.5–5.1)
Sodium: 136 mmol/L (ref 135–145)

## 2022-09-05 LAB — CBC
HCT: 44 % (ref 36.0–46.0)
Hemoglobin: 14.5 g/dL (ref 12.0–15.0)
MCH: 29.2 pg (ref 26.0–34.0)
MCHC: 33 g/dL (ref 30.0–36.0)
MCV: 88.5 fL (ref 80.0–100.0)
Platelets: 185 10*3/uL (ref 150–400)
RBC: 4.97 MIL/uL (ref 3.87–5.11)
RDW: 13.2 % (ref 11.5–15.5)
WBC: 8.3 10*3/uL (ref 4.0–10.5)
nRBC: 0 % (ref 0.0–0.2)

## 2022-09-05 LAB — TROPONIN I (HIGH SENSITIVITY): Troponin I (High Sensitivity): 10 ng/L (ref <18)

## 2022-09-05 MED ORDER — LORAZEPAM 2 MG/ML IJ SOLN: 1.0000 mg | Freq: Once | INTRAMUSCULAR | Status: DC | PRN

## 2022-09-05 NOTE — ED Provider Notes (Signed)
[facility] Provider Note   Event Date/Time   First MD Initiated Contact with Patient 09/05/22 [phone]     (approximate) History  Chest Pain and Drug / Alcohol Assessment  HPI Maureen Cook is a 57 y.o. female with a stated past medical history of cocaine and alcohol abuse who presents complaining of significant chest pain that began this morning after using the substances.  Patient states that she has had mild chest pain in the past but has never been this severe.  Patient did have associated anxiety and shortness of breath. ROS: Patient currently denies any vision changes, tinnitus, difficulty speaking, facial droop, sore throat, abdominal pain, nausea/vomiting/diarrhea, dysuria, or weakness/numbness/paresthesias in any extremity   Physical Exam  Triage Vital Signs: ED Triage Vitals [09/05/22 0715]  Enc Vitals Group     BP (!) 151/61     Pulse Rate 90     Resp 16     Temp 99.1 F (37.3 C)     Temp src      SpO2 95 %     Weight 104 lb (47.2 kg)     Height 5\' 2"  (1.575 m)     Head Circumference      Peak Flow      Pain Score 7     Pain Loc      Pain Edu?      Excl. in GC?    Most recent vital signs: Vitals:   09/05/22 0715  BP: (!) 151/61  Pulse: 90  Resp: 16  Temp: 99.1 F (37.3 C)  SpO2: 95%   General: Awake, oriented x4. CV:  Good peripheral perfusion.  Resp:  Normal effort.  Abd:  No distention.  Other:  Middle-aged well-developed, well-nourished Caucasian female laying in bed in no acute distress. ED Results / Procedures / Treatments  Labs (all labs ordered are listed, but only abnormal results are displayed) Labs Reviewed  BASIC METABOLIC PANEL - Abnormal; Notable for the following components:      Result Value   Potassium 3.0 (*)    CO2 20 (*)    Glucose, Bld 132 (*)    All other components within normal limits  CBC  TROPONIN I (HIGH SENSITIVITY)   EKG ED ECG REPORT I, Merwyn Katos, the attending physician, personally viewed  and interpreted this ECG. Date: 09/05/2022 EKG Time: 0720 Rate: 87 Rhythm: normal sinus rhythm QRS Axis: normal Intervals: normal ST/T Wave abnormalities: normal Narrative Interpretation: no evidence of acute ischemia RADIOLOGY ED MD interpretation: 2 view chest x-ray interpreted by me shows no evidence of acute abnormalities including no pneumonia, pneumothorax, or widened mediastinum -Agree with radiology assessment Official radiology report(s): DG Chest 2 View  Result Date: 09/05/2022 CLINICAL DATA:  57 year old female with history of chest pain. EXAM: CHEST - 2 VIEW COMPARISON:  Chest x-ray 03/17/2020. FINDINGS: Lung volumes are normal. No consolidative airspace disease. No pleural effusions. No pneumothorax. No pulmonary nodule or mass noted. Pulmonary vasculature and the cardiomediastinal silhouette are within normal limits. Atherosclerosis in the thoracic aorta. IMPRESSION: 1.  No radiographic evidence of acute cardiopulmonary disease. 2. Aortic atherosclerosis. Electronically Signed   By: Trudie Reed M.D.   On: 09/05/2022 07:55   PROCEDURES: Critical Care performed: No .1-3 Lead EKG Interpretation  Performed by: Merwyn Katos, MD Authorized by: Merwyn Katos, MD     Interpretation: normal     ECG rate:  71   ECG rate assessment: normal     Rhythm:  sinus rhythm     Ectopy: none     Conduction: normal    MEDICATIONS ORDERED IN ED: Medications - No data to display IMPRESSION / MDM / ASSESSMENT AND PLAN / ED COURSE  I reviewed the triage vital signs and the nursing notes.                             The patient is on the cardiac monitor to evaluate for evidence of arrhythmia and/or significant heart rate changes. Patient's presentation is most consistent with acute presentation with potential threat to life or bodily function. Presents with altered mental status and chest pain after alcohol and cocaine use Stated EtOH and crack cocaine intoxication. Airway  maintained. Unlikely intracranial bleed, opioid intoxication, sepsis, hypothyroidism. Suspect likely transient course of intoxication with expected  improvement of symptoms as patient metabolizes offending agent.  Plan: frequent reassessments  Reassessment Note: Time: 2 hours since initial presentation. Evaluation: Frequent mental status exams showed improving symptoms and evidence that the patients chest pain was secondary to intoxication. Pt able to ambulate without difficulty and PO tolerant. Plan DC home with ride and return precautions. Disposition: Discharge home    FINAL CLINICAL IMPRESSION(S) / ED DIAGNOSES   Final diagnoses:  Chest pain, unspecified type  Cocaine abuse (HCC)   Rx / DC Orders   ED Discharge Orders     None      Note:  This document was prepared using Dragon voice recognition software and may include unintentional dictation errors.   Merwyn Katos, MD 09/06/22 [phone]

## 2022-09-05 NOTE — ED Triage Notes (Addendum)
First nurse note: Patient [facility] by EMS from [facility]. Patient reports she smoked crack around 3:30am and "does not feel right." C/o racing heart, CP, and SOB. Patient believes "the crack was laced with something"  EMS vitals: 150/78b/p 82HR 98% RA

## 2022-10-26 ENCOUNTER — Other Ambulatory Visit: Payer: Self-pay

## 2022-10-26 ENCOUNTER — Encounter: Payer: Self-pay | Admitting: Emergency Medicine

## 2022-10-26 DIAGNOSIS — K047 Periapical abscess without sinus: Secondary | ICD-10-CM | POA: Insufficient documentation

## 2022-10-26 DIAGNOSIS — R6884 Jaw pain: Secondary | ICD-10-CM | POA: Diagnosis present

## 2022-10-26 NOTE — ED Triage Notes (Signed)
Pt presents ambulatory to triage via POV with complaints of L sided jaw pain and swelling x 1 day. Pt notes having motrin ~ 2 hours ago with minimal pain. Pt is unable to differentiate if she is having dental pain as well as jaw pain or what occurred first. A&Ox4 at this time. Denies CP or SOB.

## 2022-10-27 ENCOUNTER — Emergency Department
Admission: EM | Admit: 2022-10-27 | Discharge: 2022-10-27 | Disposition: A | Discharge status: Home / Self Care | Payer: Medicaid Other | Attending: Emergency Medicine | Admitting: Emergency Medicine

## 2022-10-27 DIAGNOSIS — K047 Periapical abscess without sinus: Secondary | ICD-10-CM

## 2022-10-27 MED ORDER — LACTATED RINGERS IV BOLUS: 1000.0000 mL | Freq: Once | INTRAVENOUS | Status: DC

## 2022-10-27 MED ORDER — IBUPROFEN 600 MG PO TABS
600.0000 mg | ORAL_TABLET | Freq: Once | ORAL | Status: AC
  Administered 2022-10-27: 600 mg via ORAL
  Filled 2022-10-27: qty 1

## 2022-10-27 MED ORDER — TRAMADOL HCL 50 MG PO TABS: 50.0000 mg | ORAL_TABLET | Freq: Four times a day (QID) | ORAL | 0 refills | Status: AC | PRN

## 2022-10-27 MED ORDER — IBUPROFEN 600 MG PO TABS: 600.0000 mg | ORAL_TABLET | Freq: Three times a day (TID) | ORAL | 0 refills | Status: AC | PRN

## 2022-10-27 MED ORDER — TRAMADOL HCL 50 MG PO TABS: 50.0000 mg | ORAL_TABLET | Freq: Four times a day (QID) | ORAL | 0 refills | Status: DC | PRN

## 2022-10-27 MED ORDER — IBUPROFEN 600 MG PO TABS: 600.0000 mg | ORAL_TABLET | Freq: Three times a day (TID) | ORAL | 0 refills | Status: DC | PRN

## 2022-10-27 MED ORDER — SODIUM CHLORIDE 0.9 % IV SOLN: 3.0000 g | Freq: Once | INTRAVENOUS | Status: DC

## 2022-10-27 MED ORDER — AMOXICILLIN-POT CLAVULANATE 875-125 MG PO TABS
1.0000 | ORAL_TABLET | Freq: Once | ORAL | Status: AC
  Administered 2022-10-27: 1 via ORAL
  Filled 2022-10-27: qty 1

## 2022-10-27 MED ORDER — AMOXICILLIN-POT CLAVULANATE 875-125 MG PO TABS: 1.0000 | ORAL_TABLET | Freq: Two times a day (BID) | ORAL | 0 refills | Status: DC

## 2022-10-27 MED ORDER — AMOXICILLIN-POT CLAVULANATE 875-125 MG PO TABS: 1.0000 | ORAL_TABLET | Freq: Two times a day (BID) | ORAL | 0 refills | Status: AC

## 2022-10-27 NOTE — ED Provider Notes (Signed)
[facility] Provider Note    Event Date/Time   First MD Initiated Contact with Patient 10/27/22 0017     (approximate)   History   Jaw Pain   HPI  Maureen Cook is a 57 y.o. female here with jaw pain.  The patient states that over the last 48 hours she has had progressively worsening aching, throbbing, pain in her left jaw.  She has a history of poor dentition with recurrent dental abscesses.  Symptoms feel similar to that.  Denies any difficulty swallowing.  No difficulty speaking.  Symptoms feel similar to her previous abscesses.  Denies any other acute changes in health.  She is not diabetic or immunosuppressed.     Physical Exam   Triage Vital Signs: ED Triage Vitals  Enc Vitals Group     BP 10/26/22 2222 125/78     Pulse Rate 10/26/22 2222 73     Resp 10/26/22 2222 18     Temp 10/26/22 2222 99.2 F (37.3 C)     Temp Source 10/26/22 2222 Oral     SpO2 10/26/22 2222 98 %     Weight 10/26/22 2220 106 lb (48.1 kg)     Height 10/26/22 2220 5\' 2"  (1.575 m)     Head Circumference --      Peak Flow --      Pain Score 10/26/22 2220 9     Pain Loc --      Pain Edu? --      Excl. in GC? --     Most recent vital signs: Vitals:   10/27/22 0055 10/27/22 0100  BP:  (!) 113/55  Pulse: 75   Resp:    Temp:    SpO2: 96%      General: Awake, no distress.  CV:  Good peripheral perfusion.  Resp:  Normal work of breathing.  Abd:  No distention.  Other:  Significantly poor dentition throughout the mouth.  Minimal swelling to the left mandible with no appreciable abscess.  Significant tenderness to the left lower molars.  No fluctuance.  No purulence.  Sublingual space is soft.  Neck supple.   ED Results / Procedures / Treatments   Labs (all labs ordered are listed, but only abnormal results are displayed) Labs Reviewed - No data to display   EKG    RADIOLOGY    I also independently reviewed and agree with radiologist  interpretations.   PROCEDURES:  Critical Care performed: No   MEDICATIONS ORDERED IN ED: Medications  amoxicillin-clavulanate (AUGMENTIN) 875-125 MG per tablet 1 tablet (1 tablet Oral Given 10/27/22 0106)  ibuprofen (ADVIL) tablet 600 mg (600 mg Oral Given 10/27/22 0106)     IMPRESSION / MDM / ASSESSMENT AND PLAN / ED COURSE  I reviewed the triage vital signs and the nursing notes.                              Differential diagnosis includes, but is not limited to, dental infection, jaw osteomyelitis/osteonecrosis, dental abscess, pulpitis, gingivitis  Patient's presentation is most consistent with acute presentation with potential threat to life or bodily function.  57 year old nontoxic female here with left jaw and tooth pain.  History of poor dentition with similar presentations.  No evidence of sublingual infection.  No evidence of submandibular extension.  Neck is fully supple.  She is nontoxic.  No evidence of extension to the face.  Offered patient dose of  IV antibiotics but she is ready to go and declines any kind of IV treatment or lab work.  She is otherwise nontoxic without signs of sepsis.  I discussed the need for close dental follow-up and will place on Augmentin p.o.  She was given a dose here.     FINAL CLINICAL IMPRESSION(S) / ED DIAGNOSES   Final diagnoses:  Dental infection     Rx / DC Orders   ED Discharge Orders          Ordered    amoxicillin-clavulanate (AUGMENTIN) 875-125 MG tablet  2 times daily,   Status:  Discontinued        10/27/22 0105    ibuprofen (ADVIL) 600 MG tablet  Every 8 hours PRN,   Status:  Discontinued        10/27/22 0105    traMADol (ULTRAM) 50 MG tablet  Every 6 hours PRN,   Status:  Discontinued        10/27/22 0105    amoxicillin-clavulanate (AUGMENTIN) 875-125 MG tablet  2 times daily        10/27/22 0110    ibuprofen (ADVIL) 600 MG tablet  Every 8 hours PRN        10/27/22 0110    traMADol (ULTRAM) 50 MG tablet  Every 6  hours PRN        10/27/22 0110             Note:  This document was prepared using Dragon voice recognition software and may include unintentional dictation errors.   Shaune Pollack, MD 10/27/22 [phone]

## 2022-10-27 NOTE — Discharge Instructions (Signed)
OPTIONS FOR DENTAL FOLLOW UP CARE ° °[place] [facility] [employer] [facility] - Local Safety Net Dental Clinics °http://www.ncdhhs.gov/dph/oralhealth/services/safetynetclinics.htm °  °[facility] (336-562-3123) ° °[facility] [place] (919-933-9087) ° °[facility] ([phone] ext [phone]) ° °[facility] ([phone]) ° °[facility] ([phone]) °This clinic caters to the indigent population and is on a lottery system. °Location: °[facility], [facility], [address], [place] °Clinic Hours: °Wednesdays from 6pm - 9pm, patients seen by a lottery system. °For dates, call or go to [email or web address] °Services: °Cleanings, fillings and simple extractions. °Payment Options: °DENTAL WORK IS FREE OF CHARGE. Bring proof of income or support. °Best way to get seen: °Arrive at 5:15 pm - this is a lottery, NOT first come/first serve, so arriving earlier will not increase your chances of being seen. °  °  °[facility] °[phone] °Select option 1 for emergencies °  °Location: °[facility], [facility], [address], [place] °Clinic Hours: °No walk-ins accepted - call the day before to schedule an appointment. °Check in times are 9:30 am and 1:30 pm. °Services: °Simple extractions, temporary fillings, pulpectomy/pulp debridement, uncomplicated abscess drainage. °Payment Options: °PAYMENT IS DUE AT THE TIME OF SERVICE.  Fee is usually $100-200, additional surgical procedures (e.g. abscess drainage) may be extra. °Cash, checks, Visa/MasterCard accepted.  Can file Medicaid if patient is covered for dental - patient should call case worker to check. °No discount for [facility] patients. °Best way to get seen: °MUST call the day before and get onto the schedule. Can usually be seen the next 1-2 days. No walk-ins accepted. °  °  °[facility] °[phone] °   °Location: °[facility], [address], [place] °Clinic Hours: °M, W, Th, F 8am or 1:30pm, Tues 9a or 1:30 - first come/first served. °Services: °Simple extractions, temporary fillings, uncomplicated abscess drainage.  You do not need to be an [place] resident. °Payment Options: °PAYMENT IS DUE AT THE TIME OF SERVICE. °Dental insurance, otherwise sliding scale - bring proof of income or support. °Depending on income and treatment needed, cost is usually $50-200. °Best way to get seen: °Arrive early as it is first come/first served. °  °  °[facility] °[phone] °  °Location: °[address] °Clinic Hours: °Mon-Thu 8a-5p °Services: °Most basic dental services including extractions and fillings. °Payment Options: °PAYMENT IS DUE AT THE TIME OF SERVICE. °Sliding scale, up to 50% off - bring proof if income or support. °Medicaid with dental option accepted. °Best way to get seen: °Call to schedule an appointment, can usually be seen within 2 weeks OR they will try to see walk-ins - show up at 8a or 2p (you may have to wait). °  °  °[facility] °[phone] °[place] RESIDENTS ONLY °  °Location: °[facility], [address], [place], [place] [address] °Clinic Hours: By appointment only. °Monday - Thursday 8am-5pm, Friday 8am-12pm °Services: Cleanings, fillings, extractions. °Payment Options: °PAYMENT IS DUE AT THE TIME OF SERVICE. °Cash, Visa or MasterCard. Sliding scale - $30 minimum per service. °Best way to get seen: °Come in to office, complete packet and make an appointment - need proof of income °or support monies for each household member and proof of [place] residence. °Usually takes about a month to get in. °  °  °[facility] °[phone] °  °Location: °[address]   [place] °Clinic Hours: Walk-in Urgent Care Dental Services are offered Monday-Friday  mornings only. °The numbers of emergencies accepted daily is limited to the number of °providers available. °Maximum 15 - Mondays, Wednesdays & Thursdays °Maximum 10 - Tuesdays & Fridays °Services: °You do not need to be a [place] resident to be seen for a dental emergency. °Emergencies are defined as pain, swelling, abnormal bleeding, or dental trauma. Walkins will receive x-rays if needed. °NOTE: Dental cleaning is not an emergency. °Payment Options: °PAYMENT IS DUE AT THE TIME OF SERVICE. °Minimum co-pay is $40.00 for uninsured patients. °Minimum co-pay is $3.00 for Medicaid with dental coverage. °Dental Insurance is accepted and must be presented at time of visit. °Medicare does not cover dental. °Forms of payment: Cash, credit card, checks. °Best way to get seen: °If not previously registered with the clinic, walk-in dental registration begins at 7:15 am and is on a first come/first serve basis. °If previously registered with the clinic, call to make an appointment. °  °  °[facility] °[phone] °[place] RESIDENTS ONLY °  °Location: °[address], [place], [place] °Clinic Hours: °Mon-Thu 10a-2p °Services: Extractions only! °Payment Options: °FREE (donations accepted) - bring proof of income or support °Best way to get seen: °Call and schedule an appointment OR come at 8am on the 1st Monday of every month (except for holidays) when it is first come/first served. °  °  °[facility] °[phone] °  °Location: °[address], [place] °Clinic Hours: °Friday mornings °Services, Payment Options, Best way to get seen: °Call for info °
